# Patient Record
Sex: Female | Born: 1950
Health system: Southern US, Community
[De-identification: ages and names within clinical notes are randomized; demographics above are authoritative.]

## PROBLEM LIST (undated history)

## (undated) DIAGNOSIS — N6019 Diffuse cystic mastopathy of unspecified breast: Secondary | ICD-10-CM

## (undated) DIAGNOSIS — E079 Disorder of thyroid, unspecified: Secondary | ICD-10-CM

## (undated) DIAGNOSIS — I10 Essential (primary) hypertension: Secondary | ICD-10-CM

## (undated) HISTORY — DX: Diffuse cystic mastopathy of unspecified breast: N60.19

## (undated) HISTORY — DX: Disorder of thyroid, unspecified: E07.9

## (undated) HISTORY — PX: BREAST BIOPSY: SHX20

## (undated) HISTORY — DX: Essential (primary) hypertension: I10

---

## 2011-05-02 LAB — HM DEXA SCAN

## 2013-09-16 LAB — HM COLONOSCOPY

## 2015-11-11 LAB — HM MAMMOGRAPHY

## 2016-03-18 DIAGNOSIS — M62830 Muscle spasm of back: Secondary | ICD-10-CM | POA: Diagnosis not present

## 2016-03-18 DIAGNOSIS — M9903 Segmental and somatic dysfunction of lumbar region: Secondary | ICD-10-CM | POA: Diagnosis not present

## 2016-03-18 DIAGNOSIS — M47816 Spondylosis without myelopathy or radiculopathy, lumbar region: Secondary | ICD-10-CM | POA: Diagnosis not present

## 2016-03-18 DIAGNOSIS — M9902 Segmental and somatic dysfunction of thoracic region: Secondary | ICD-10-CM | POA: Diagnosis not present

## 2016-03-21 DIAGNOSIS — M47816 Spondylosis without myelopathy or radiculopathy, lumbar region: Secondary | ICD-10-CM | POA: Diagnosis not present

## 2016-03-21 DIAGNOSIS — M62830 Muscle spasm of back: Secondary | ICD-10-CM | POA: Diagnosis not present

## 2016-03-21 DIAGNOSIS — M9903 Segmental and somatic dysfunction of lumbar region: Secondary | ICD-10-CM | POA: Diagnosis not present

## 2016-03-21 DIAGNOSIS — M9902 Segmental and somatic dysfunction of thoracic region: Secondary | ICD-10-CM | POA: Diagnosis not present

## 2016-04-04 DIAGNOSIS — M9903 Segmental and somatic dysfunction of lumbar region: Secondary | ICD-10-CM | POA: Diagnosis not present

## 2016-04-04 DIAGNOSIS — M47816 Spondylosis without myelopathy or radiculopathy, lumbar region: Secondary | ICD-10-CM | POA: Diagnosis not present

## 2016-04-04 DIAGNOSIS — M9902 Segmental and somatic dysfunction of thoracic region: Secondary | ICD-10-CM | POA: Diagnosis not present

## 2016-04-04 DIAGNOSIS — M62830 Muscle spasm of back: Secondary | ICD-10-CM | POA: Diagnosis not present

## 2016-04-06 ENCOUNTER — Encounter: Payer: Self-pay | Admitting: Family Medicine

## 2016-04-06 ENCOUNTER — Ambulatory Visit (INDEPENDENT_AMBULATORY_CARE_PROVIDER_SITE_OTHER): Payer: Medicare Other | Admitting: Family Medicine

## 2016-04-06 DIAGNOSIS — M545 Low back pain, unspecified: Secondary | ICD-10-CM

## 2016-04-06 DIAGNOSIS — E038 Other specified hypothyroidism: Secondary | ICD-10-CM

## 2016-04-06 DIAGNOSIS — R1013 Epigastric pain: Secondary | ICD-10-CM | POA: Diagnosis not present

## 2016-04-06 DIAGNOSIS — M549 Dorsalgia, unspecified: Secondary | ICD-10-CM | POA: Insufficient documentation

## 2016-04-06 DIAGNOSIS — E559 Vitamin D deficiency, unspecified: Secondary | ICD-10-CM

## 2016-04-06 DIAGNOSIS — E039 Hypothyroidism, unspecified: Secondary | ICD-10-CM | POA: Insufficient documentation

## 2016-04-06 DIAGNOSIS — E785 Hyperlipidemia, unspecified: Secondary | ICD-10-CM | POA: Insufficient documentation

## 2016-04-06 NOTE — Patient Instructions (Addendum)
Thank you for coming in today. You should hear from [facility] soon about the ultrasound.  Make sure you are fasting.  Get labs as well.  Return sooner if needed.  If your belly pain worsens, or you have high fever, bad vomiting, blood in your stool or black tarry stool go to the Emergency Room.    Abdominal Pain, Adult Many things can cause abdominal pain. Usually, abdominal pain is not caused by a disease and will improve without treatment. It can often be observed and treated at home. Your health care provider will do a physical exam and possibly order blood tests and X-rays to help determine the seriousness of your pain. However, in many cases, more time must pass before a clear cause of the pain can be found. Before that point, your health care provider may not know if you need more testing or further treatment. HOME CARE INSTRUCTIONS Monitor your abdominal pain for any changes. The following actions may help to alleviate any discomfort you are experiencing:  Only take over-the-counter or prescription medicines as directed by your health care provider.  Do not take laxatives unless directed to do so by your health care provider.  Try a clear liquid diet (broth, tea, or water) as directed by your health care provider. Slowly move to a bland diet as tolerated. SEEK MEDICAL CARE IF:  You have unexplained abdominal pain.  You have abdominal pain associated with nausea or diarrhea.  You have pain when you urinate or have a bowel movement.  You experience abdominal pain that wakes you in the night.  You have abdominal pain that is worsened or improved by eating food.  You have abdominal pain that is worsened with eating fatty foods.  You have a fever. SEEK IMMEDIATE MEDICAL CARE IF:  Your pain does not go away within 2 hours.  You keep throwing up (vomiting).  Your pain is felt only in portions of the abdomen, such as the right side or the left lower portion of the  abdomen.  You pass bloody or black tarry stools. MAKE SURE YOU:  Understand these instructions.  Will watch your condition.  Will get help right away if you are not doing well or get worse.   This information is not intended to replace advice given to you by your health care provider. Make sure you discuss any questions you have with your health care provider.   Document Released: 06/01/2005 Document Revised: 05/13/2015 Document Reviewed: 05/01/2013 Elsevier Interactive Patient Education [employer]

## 2016-04-06 NOTE — Progress Notes (Signed)
Melanie Wilkinson is a 65 y.o. female who presents to [facility]: Primary Care Sports Medicine today for establish care discussed back and abdominal pain.   Patient notes onset of midepigastric abdominal pain over the last few days. She feels bloated and nauseated with no vomiting. Pain is mild. Food seems to worsen the pain. She notes normal bowel movements without blood. No vaginal discharge or urinary frequency urgency or dysuria. She has not tried any medications yet.  Additionally patient notes left low back pain occurring about 3 days ago. Pain is worse with activity better with rest. No radiating pain weakness or numbness. No treatment tried yet.  She feels well otherwise.  Patient has a history of hyperlipidemia, hypothyroidism, and vitamin D deficiency. She feels well with her medications listed below.  Past Medical History:  Diagnosis Date  . Hypertension   . Thyroid disease    History reviewed. No pertinent surgical history. Social History  Substance Use Topics  . Smoking status: Former Games developer  . Smokeless tobacco: Never Used  . Alcohol use Yes   family history includes Hypertension in her father and mother.  ROS as above: No headache, visual changes, nausea, vomiting, diarrhea, constipation, dizziness, abdominal pain, skin rash, fevers, chills, night sweats, weight loss, swollen lymph nodes, body aches, joint swelling, muscle aches, chest pain, shortness of breath, mood changes, visual or auditory hallucinations.     Medications: Current Outpatient Prescriptions  Medication Sig Dispense Refill  . atorvastatin (LIPITOR) 20 MG tablet     . levothyroxine (SYNTHROID, LEVOTHROID) 50 MCG tablet      No current facility-administered medications for this visit.    No Known Allergies   Exam:  BP 124/79   Pulse 85   Ht 5\' 3"  (1.6 m)   Wt 146 lb (66.2 kg)   BMI 25.86 kg/m  Gen: Well  NAD HEENT: EOMI,  MMM Lungs: Normal work of breathing. CTABL Heart: RRR no MRG Abd: NABS, Soft. Nondistended, Mildly tender without rebound or guarding or masses palpated Exts: Brisk capillary refill, warm and well perfused.  Back: Nontender to midline. Tender palpation left lumbar paraspinal muscles. Normal back motion. Normal lower extremity strength and gait.  No results found for this or any previous visit (from the past 24 hour(s)). No results found.    Assessment and Plan: 65 y.o. female with   Abdominal pain: Unclear etiology. Plan to obtain abdominal ultrasound CBC CMP and lipase. Return if not better.  Low back pain: Likely muscular Dysfunction. Plan for eating pattern home exercise program.  Hyperlipidemia: Check fasting lipids continue statin  Hypothyroidism: Check TSH. Continue levothyroxine  Vitamin D deficiency: Check vitamin D  Patient declines shingles and pneumonia vaccines as well as influenza vaccines.   Orders Placed This Encounter  Procedures  . US Abdomen Complete    Order Specific Question:   Reason for Exam (SYMPTOM  OR DIAGNOSIS REQUIRED)    Answer:   epigastric pain    Order Specific Question:   Preferred imaging location?    [facility]:   [facility]  . CBC  . Comprehensive metabolic panel    Order Specific Question:   Has the patient fasted?    Answer:   No  . Lipase  . Lipid panel    Order Specific Question:   Has the patient fasted?    Answer:   No  . TSH  . VITAMIN D 25 Hydroxy (Vit-D Deficiency, Fractures)    Discussed warning  signs or symptoms. Please see discharge instructions. Patient expresses understanding.

## 2016-04-07 ENCOUNTER — Ambulatory Visit (INDEPENDENT_AMBULATORY_CARE_PROVIDER_SITE_OTHER): Payer: Medicare Other

## 2016-04-07 DIAGNOSIS — R1013 Epigastric pain: Secondary | ICD-10-CM | POA: Diagnosis not present

## 2016-04-07 DIAGNOSIS — E038 Other specified hypothyroidism: Secondary | ICD-10-CM | POA: Diagnosis not present

## 2016-04-07 DIAGNOSIS — E559 Vitamin D deficiency, unspecified: Secondary | ICD-10-CM | POA: Diagnosis not present

## 2016-04-07 DIAGNOSIS — R140 Abdominal distension (gaseous): Secondary | ICD-10-CM | POA: Diagnosis not present

## 2016-04-07 DIAGNOSIS — R110 Nausea: Secondary | ICD-10-CM

## 2016-04-07 DIAGNOSIS — E785 Hyperlipidemia, unspecified: Secondary | ICD-10-CM | POA: Diagnosis not present

## 2016-04-07 LAB — LIPID PANEL
Cholesterol: 143 mg/dL (ref 125–200)
HDL: 67 mg/dL (ref >=46)
LDL CALC: 59 mg/dL (ref <130)
TRIGLYCERIDES: 85 mg/dL (ref <150)
Total CHOL/HDL Ratio: 2.1 Ratio (ref <=5.0)
VLDL: 17 mg/dL (ref <30)

## 2016-04-07 LAB — CBC
HEMATOCRIT: 42.3 % (ref 35.0–45.0)
Hemoglobin: 13.7 g/dL (ref 11.7–15.5)
MCH: 29.7 pg (ref 27.0–33.0)
MCHC: 32.4 g/dL (ref 32.0–36.0)
MCV: 91.8 fL (ref 80.0–100.0)
MPV: 10.6 fL (ref 7.5–12.5)
Platelets: 340 10*3/uL (ref 140–400)
RBC: 4.61 MIL/uL (ref 3.80–5.10)
RDW: 13.1 % (ref 11.0–15.0)
WBC: 3.4 10*3/uL — ABNORMAL LOW (ref 3.8–10.8)

## 2016-04-07 LAB — COMPREHENSIVE METABOLIC PANEL
ALK PHOS: 57 U/L (ref 33–130)
ALT: 19 U/L (ref 6–29)
AST: 21 U/L (ref 10–35)
Albumin: 3.9 g/dL (ref 3.6–5.1)
BUN: 11 mg/dL (ref 7–25)
CALCIUM: 9.3 mg/dL (ref 8.6–10.4)
CO2: 24 mmol/L (ref 20–31)
Chloride: 107 mmol/L (ref 98–110)
Creat: 0.73 mg/dL (ref 0.50–0.99)
GLUCOSE: 91 mg/dL (ref 65–99)
POTASSIUM: 4.3 mmol/L (ref 3.5–5.3)
Sodium: 142 mmol/L (ref 135–146)
Total Bilirubin: 0.3 mg/dL (ref 0.2–1.2)
Total Protein: 6.5 g/dL (ref 6.1–8.1)

## 2016-04-07 LAB — LIPASE: Lipase: 25 U/L (ref 7–60)

## 2016-04-07 LAB — TSH: TSH: 3.45 mIU/L

## 2016-04-08 LAB — VITAMIN D 25 HYDROXY (VIT D DEFICIENCY, FRACTURES): Vit D, 25-Hydroxy: 45 ng/mL (ref 30–100)

## 2016-04-12 ENCOUNTER — Encounter: Payer: Self-pay | Admitting: Family Medicine

## 2016-04-25 DIAGNOSIS — M9903 Segmental and somatic dysfunction of lumbar region: Secondary | ICD-10-CM | POA: Diagnosis not present

## 2016-04-25 DIAGNOSIS — M9902 Segmental and somatic dysfunction of thoracic region: Secondary | ICD-10-CM | POA: Diagnosis not present

## 2016-04-25 DIAGNOSIS — M62830 Muscle spasm of back: Secondary | ICD-10-CM | POA: Diagnosis not present

## 2016-04-25 DIAGNOSIS — M47816 Spondylosis without myelopathy or radiculopathy, lumbar region: Secondary | ICD-10-CM | POA: Diagnosis not present

## 2016-04-27 DIAGNOSIS — M9903 Segmental and somatic dysfunction of lumbar region: Secondary | ICD-10-CM | POA: Diagnosis not present

## 2016-04-27 DIAGNOSIS — M47816 Spondylosis without myelopathy or radiculopathy, lumbar region: Secondary | ICD-10-CM | POA: Diagnosis not present

## 2016-04-27 DIAGNOSIS — M62830 Muscle spasm of back: Secondary | ICD-10-CM | POA: Diagnosis not present

## 2016-04-27 DIAGNOSIS — M9902 Segmental and somatic dysfunction of thoracic region: Secondary | ICD-10-CM | POA: Diagnosis not present

## 2016-04-29 DIAGNOSIS — M9902 Segmental and somatic dysfunction of thoracic region: Secondary | ICD-10-CM | POA: Diagnosis not present

## 2016-04-29 DIAGNOSIS — M62830 Muscle spasm of back: Secondary | ICD-10-CM | POA: Diagnosis not present

## 2016-04-29 DIAGNOSIS — M546 Pain in thoracic spine: Secondary | ICD-10-CM | POA: Diagnosis not present

## 2016-04-29 DIAGNOSIS — M47816 Spondylosis without myelopathy or radiculopathy, lumbar region: Secondary | ICD-10-CM | POA: Diagnosis not present

## 2016-04-29 DIAGNOSIS — M9903 Segmental and somatic dysfunction of lumbar region: Secondary | ICD-10-CM | POA: Diagnosis not present

## 2016-05-02 DIAGNOSIS — M546 Pain in thoracic spine: Secondary | ICD-10-CM | POA: Diagnosis not present

## 2016-05-02 DIAGNOSIS — M47816 Spondylosis without myelopathy or radiculopathy, lumbar region: Secondary | ICD-10-CM | POA: Diagnosis not present

## 2016-05-02 DIAGNOSIS — M9902 Segmental and somatic dysfunction of thoracic region: Secondary | ICD-10-CM | POA: Diagnosis not present

## 2016-05-02 DIAGNOSIS — M62830 Muscle spasm of back: Secondary | ICD-10-CM | POA: Diagnosis not present

## 2016-05-02 DIAGNOSIS — M9903 Segmental and somatic dysfunction of lumbar region: Secondary | ICD-10-CM | POA: Diagnosis not present

## 2016-05-04 DIAGNOSIS — M546 Pain in thoracic spine: Secondary | ICD-10-CM | POA: Diagnosis not present

## 2016-05-04 DIAGNOSIS — M9903 Segmental and somatic dysfunction of lumbar region: Secondary | ICD-10-CM | POA: Diagnosis not present

## 2016-05-04 DIAGNOSIS — M47816 Spondylosis without myelopathy or radiculopathy, lumbar region: Secondary | ICD-10-CM | POA: Diagnosis not present

## 2016-05-04 DIAGNOSIS — M9902 Segmental and somatic dysfunction of thoracic region: Secondary | ICD-10-CM | POA: Diagnosis not present

## 2016-05-06 DIAGNOSIS — M47816 Spondylosis without myelopathy or radiculopathy, lumbar region: Secondary | ICD-10-CM | POA: Diagnosis not present

## 2016-05-06 DIAGNOSIS — M9903 Segmental and somatic dysfunction of lumbar region: Secondary | ICD-10-CM | POA: Diagnosis not present

## 2016-05-06 DIAGNOSIS — M546 Pain in thoracic spine: Secondary | ICD-10-CM | POA: Diagnosis not present

## 2016-05-06 DIAGNOSIS — M9902 Segmental and somatic dysfunction of thoracic region: Secondary | ICD-10-CM | POA: Diagnosis not present

## 2016-05-10 DIAGNOSIS — M9902 Segmental and somatic dysfunction of thoracic region: Secondary | ICD-10-CM | POA: Diagnosis not present

## 2016-05-10 DIAGNOSIS — M546 Pain in thoracic spine: Secondary | ICD-10-CM | POA: Diagnosis not present

## 2016-05-10 DIAGNOSIS — M9903 Segmental and somatic dysfunction of lumbar region: Secondary | ICD-10-CM | POA: Diagnosis not present

## 2016-05-10 DIAGNOSIS — M47816 Spondylosis without myelopathy or radiculopathy, lumbar region: Secondary | ICD-10-CM | POA: Diagnosis not present

## 2016-05-11 DIAGNOSIS — M47816 Spondylosis without myelopathy or radiculopathy, lumbar region: Secondary | ICD-10-CM | POA: Diagnosis not present

## 2016-05-11 DIAGNOSIS — M9903 Segmental and somatic dysfunction of lumbar region: Secondary | ICD-10-CM | POA: Diagnosis not present

## 2016-05-11 DIAGNOSIS — M546 Pain in thoracic spine: Secondary | ICD-10-CM | POA: Diagnosis not present

## 2016-05-11 DIAGNOSIS — M9902 Segmental and somatic dysfunction of thoracic region: Secondary | ICD-10-CM | POA: Diagnosis not present

## 2016-05-16 ENCOUNTER — Encounter: Payer: Self-pay | Admitting: Family Medicine

## 2016-05-16 DIAGNOSIS — M9903 Segmental and somatic dysfunction of lumbar region: Secondary | ICD-10-CM | POA: Diagnosis not present

## 2016-05-16 DIAGNOSIS — M8580 Other specified disorders of bone density and structure, unspecified site: Secondary | ICD-10-CM | POA: Insufficient documentation

## 2016-05-16 DIAGNOSIS — I253 Aneurysm of heart: Secondary | ICD-10-CM | POA: Insufficient documentation

## 2016-05-16 DIAGNOSIS — M47816 Spondylosis without myelopathy or radiculopathy, lumbar region: Secondary | ICD-10-CM | POA: Diagnosis not present

## 2016-05-16 DIAGNOSIS — M546 Pain in thoracic spine: Secondary | ICD-10-CM | POA: Diagnosis not present

## 2016-05-16 DIAGNOSIS — M9902 Segmental and somatic dysfunction of thoracic region: Secondary | ICD-10-CM | POA: Diagnosis not present

## 2016-05-18 DIAGNOSIS — M546 Pain in thoracic spine: Secondary | ICD-10-CM | POA: Diagnosis not present

## 2016-05-18 DIAGNOSIS — M9903 Segmental and somatic dysfunction of lumbar region: Secondary | ICD-10-CM | POA: Diagnosis not present

## 2016-05-18 DIAGNOSIS — M47816 Spondylosis without myelopathy or radiculopathy, lumbar region: Secondary | ICD-10-CM | POA: Diagnosis not present

## 2016-05-18 DIAGNOSIS — M9902 Segmental and somatic dysfunction of thoracic region: Secondary | ICD-10-CM | POA: Diagnosis not present

## 2016-05-19 DIAGNOSIS — M9903 Segmental and somatic dysfunction of lumbar region: Secondary | ICD-10-CM | POA: Diagnosis not present

## 2016-05-19 DIAGNOSIS — M546 Pain in thoracic spine: Secondary | ICD-10-CM | POA: Diagnosis not present

## 2016-05-19 DIAGNOSIS — M9902 Segmental and somatic dysfunction of thoracic region: Secondary | ICD-10-CM | POA: Diagnosis not present

## 2016-05-19 DIAGNOSIS — M47816 Spondylosis without myelopathy or radiculopathy, lumbar region: Secondary | ICD-10-CM | POA: Diagnosis not present

## 2016-05-30 DIAGNOSIS — M47816 Spondylosis without myelopathy or radiculopathy, lumbar region: Secondary | ICD-10-CM | POA: Diagnosis not present

## 2016-05-30 DIAGNOSIS — M546 Pain in thoracic spine: Secondary | ICD-10-CM | POA: Diagnosis not present

## 2016-05-30 DIAGNOSIS — M9903 Segmental and somatic dysfunction of lumbar region: Secondary | ICD-10-CM | POA: Diagnosis not present

## 2016-05-30 DIAGNOSIS — M9902 Segmental and somatic dysfunction of thoracic region: Secondary | ICD-10-CM | POA: Diagnosis not present

## 2016-06-01 DIAGNOSIS — M9902 Segmental and somatic dysfunction of thoracic region: Secondary | ICD-10-CM | POA: Diagnosis not present

## 2016-06-01 DIAGNOSIS — M9903 Segmental and somatic dysfunction of lumbar region: Secondary | ICD-10-CM | POA: Diagnosis not present

## 2016-06-01 DIAGNOSIS — M47816 Spondylosis without myelopathy or radiculopathy, lumbar region: Secondary | ICD-10-CM | POA: Diagnosis not present

## 2016-06-01 DIAGNOSIS — M546 Pain in thoracic spine: Secondary | ICD-10-CM | POA: Diagnosis not present

## 2016-06-02 DIAGNOSIS — M9902 Segmental and somatic dysfunction of thoracic region: Secondary | ICD-10-CM | POA: Diagnosis not present

## 2016-06-02 DIAGNOSIS — M9903 Segmental and somatic dysfunction of lumbar region: Secondary | ICD-10-CM | POA: Diagnosis not present

## 2016-06-02 DIAGNOSIS — M546 Pain in thoracic spine: Secondary | ICD-10-CM | POA: Diagnosis not present

## 2016-06-02 DIAGNOSIS — M47816 Spondylosis without myelopathy or radiculopathy, lumbar region: Secondary | ICD-10-CM | POA: Diagnosis not present

## 2016-06-06 ENCOUNTER — Encounter: Payer: Self-pay | Admitting: Family Medicine

## 2016-06-15 DIAGNOSIS — M9902 Segmental and somatic dysfunction of thoracic region: Secondary | ICD-10-CM | POA: Diagnosis not present

## 2016-06-15 DIAGNOSIS — M9903 Segmental and somatic dysfunction of lumbar region: Secondary | ICD-10-CM | POA: Diagnosis not present

## 2016-06-15 DIAGNOSIS — M47816 Spondylosis without myelopathy or radiculopathy, lumbar region: Secondary | ICD-10-CM | POA: Diagnosis not present

## 2016-06-15 DIAGNOSIS — M546 Pain in thoracic spine: Secondary | ICD-10-CM | POA: Diagnosis not present

## 2016-10-12 DIAGNOSIS — Z23 Encounter for immunization: Secondary | ICD-10-CM | POA: Diagnosis not present

## 2016-10-13 ENCOUNTER — Ambulatory Visit: Payer: Medicare Other

## 2017-01-24 ENCOUNTER — Encounter: Payer: Self-pay | Admitting: Family Medicine

## 2017-01-25 MED ORDER — ATORVASTATIN CALCIUM 20 MG PO TABS: 20.0000 mg | ORAL_TABLET | Freq: Every day | ORAL | 1 refills | Status: DC

## 2017-04-06 ENCOUNTER — Emergency Department (INDEPENDENT_AMBULATORY_CARE_PROVIDER_SITE_OTHER)
Admission: EM | Admit: 2017-04-06 | Discharge: 2017-04-06 | Disposition: A | Discharge status: Home / Self Care | Payer: Medicare Other | Source: Home / Self Care | Attending: Family Medicine | Admitting: Family Medicine

## 2017-04-06 ENCOUNTER — Emergency Department (INDEPENDENT_AMBULATORY_CARE_PROVIDER_SITE_OTHER): Payer: Medicare Other

## 2017-04-06 ENCOUNTER — Encounter: Payer: Self-pay | Admitting: Emergency Medicine

## 2017-04-06 DIAGNOSIS — M25572 Pain in left ankle and joints of left foot: Secondary | ICD-10-CM | POA: Diagnosis not present

## 2017-04-06 DIAGNOSIS — S8262XA Displaced fracture of lateral malleolus of left fibula, initial encounter for closed fracture: Secondary | ICD-10-CM

## 2017-04-06 DIAGNOSIS — S99912A Unspecified injury of left ankle, initial encounter: Secondary | ICD-10-CM | POA: Diagnosis not present

## 2017-04-06 DIAGNOSIS — M7989 Other specified soft tissue disorders: Secondary | ICD-10-CM | POA: Diagnosis not present

## 2017-04-06 DIAGNOSIS — S99922A Unspecified injury of left foot, initial encounter: Secondary | ICD-10-CM | POA: Diagnosis not present

## 2017-04-06 NOTE — ED Provider Notes (Signed)
CSN: [id number]     Arrival date & time 04/06/17  1701 History   First MD Initiated Contact with Patient 04/06/17 1739     Chief Complaint  Patient presents with  . Ankle Injury   (Consider location/radiation/quality/duration/timing/severity/associated sxs/prior Treatment) HPI Melanie Wilkinson is a 66 y.o. female presenting to UC with c/o Left lateral ankle pain that started about 2 days ago after she [place], twisting her ankle.  She has noticed worsening swelling and bruising.  She has used an OTC ankle sleeve and taken ibuprofen with mild relief.  Pain is aching and sore, 5/10. Worse with plantarflexion. No other injuries from the fall. No prior injury or surgery to Left ankle.    Past Medical History:  Diagnosis Date  . Hypertension   . Thyroid disease    History reviewed. No pertinent surgical history. Family History  Problem Relation Age of Onset  . Hypertension Mother   . Hypertension Father    Social History  Substance Use Topics  . Smoking status: Former Games developermoker  . Smokeless tobacco: Never Used  . Alcohol use Yes   OB History    No data available     Review of Systems  Musculoskeletal: Positive for arthralgias, gait problem and joint swelling. Negative for myalgias.  Skin: Positive for color change. Negative for wound.  Neurological: Negative for weakness and numbness.    Allergies  Patient has no known allergies.  Home Medications   Prior to Admission medications   Medication Sig Start Date End Date Taking? Authorizing Provider  atorvastatin (LIPITOR) 20 MG tablet Take 1 tablet (20 mg total) by mouth daily at 6 PM. 01/25/17   Rodolph Bongorey, Evan S, MD  levothyroxine Erline Levine(SYNTHROID, LEVOTHROID) 50 MCG tablet  02/16/16   [provider]   Meds Ordered and Administered this Visit  Medications - No data to display  BP 135/78 (BP Location: Left Arm)   Pulse 73   Temp 98.2 F (36.8 C) (Oral)   Ht 5\' 3"  (1.6 m)   Wt 144 lb (65.3 kg)    SpO2 98%   BMI 25.51 kg/m  No data found.   Physical Exam  Constitutional: She is oriented to person, place, and time. She appears well-developed and well-nourished. No distress.  HENT:  Head: Normocephalic and atraumatic.  Eyes: EOM are normal.  Neck: Normal range of motion.  Cardiovascular: Normal rate.   Pulmonary/Chest: Effort normal.  Musculoskeletal: Normal range of motion. She exhibits edema and tenderness.  Left ankle and foot: mild to moderate edema, worse over dorsal aspect of foot. Tenderness to Lateral ankle. Full ROM, increased pain with full plantarflexion Calf is soft, non-tender.  Neurological: She is alert and oriented to person, place, and time.  Skin: Skin is warm and dry. Capillary refill takes less than 2 seconds. She is not diaphoretic.  Left ankle and foot: skin in tact. Diffuse ecchymosis   Psychiatric: She has a normal mood and affect. Her behavior is normal.  Nursing note and vitals reviewed.   Urgent Care Course     Procedures (including critical care time)  Labs Review Labs Reviewed - No data to display  Imaging Review Dg Ankle Complete Left  Result Date: 04/06/2017 CLINICAL DATA:  Fall, left ankle pain/swelling EXAM: LEFT ANKLE COMPLETE - 3+ VIEW COMPARISON:  None. FINDINGS: Suspected cortical irregularity involving the distal aspect of the lateral malleolus, suggesting nondisplaced fracture. Overlying mild soft tissue swelling. The ankle mortise is intact. IMPRESSION: Cortical  irregularity involving the distal aspect of the lateral malleolus, suggesting nondisplaced fracture. Correlate for point tenderness. Mild lateral soft tissue swelling. Electronically Signed   By: Sriyesh  Krishnan M.D.   On: 04/06/2017 17:58   Dg Foot CompCharline Billslete Left  Result Date: 04/06/2017 CLINICAL DATA:  Fall, lateral pain/swell EXAM: LEFT FOOT - COMPLETE 3+ VIEW COMPARISON:  None. FINDINGS: No fracture or dislocation is seen. Mild degenerative changes of the 1st MTP joint.  The visualized soft tissues are unremarkable. Small plantar and posterior calcaneal enthesophytes. IMPRESSION: Negative. Electronically Signed   By: Charline BillsSriyesh  Krishnan M.D.   On: 04/06/2017 17:58      MDM   1. Pain of joint of left ankle and foot   2. Closed fracture of distal lateral malleolus of ankle, left, initial encounter    Plain films and exam c/w nondisplaced fracture of lateral malleolus  Pt put in cam walker boot for comfort Home care instructions provided Declined prescription pain medication F/u with PCP, Dr. Denyse Amassorey, Sports Medicine in 1-2 weeks for recheck of symptoms and continued management of injury.     Lurene Shadowhelps, Angellynn Kimberlin O, [place]C 04/06/17 [phone]

## 2017-04-06 NOTE — Discharge Instructions (Signed)
°  You may take 500mg  acetaminophen every 4-6 hours or in combination with ibuprofen 400-600mg  every 6-8 hours as needed for pain and swelling.  You may take your boot off to bath and if needed to sleep but it is recommended you wear it when walking around to help allow ankle to heal.  There are home stretches you may try once the initial pain starts to improve, however, if pain is moderately to significantly worse with the exercises, wait a few more days before retrying.

## 2017-04-06 NOTE — ED Triage Notes (Signed)
Left ankle and foot injury while walking on a slippery boardwalk fell and twisted.

## 2017-04-20 ENCOUNTER — Ambulatory Visit (INDEPENDENT_AMBULATORY_CARE_PROVIDER_SITE_OTHER): Payer: Medicare Other | Admitting: Family Medicine

## 2017-04-20 ENCOUNTER — Ambulatory Visit (INDEPENDENT_AMBULATORY_CARE_PROVIDER_SITE_OTHER): Payer: Medicare Other

## 2017-04-20 VITALS — BP 118/83 | HR 72 | Wt 149.0 lb

## 2017-04-20 DIAGNOSIS — S82892A Other fracture of left lower leg, initial encounter for closed fracture: Secondary | ICD-10-CM | POA: Insufficient documentation

## 2017-04-20 DIAGNOSIS — M8589 Other specified disorders of bone density and structure, multiple sites: Secondary | ICD-10-CM | POA: Diagnosis not present

## 2017-04-20 DIAGNOSIS — E038 Other specified hypothyroidism: Secondary | ICD-10-CM | POA: Diagnosis not present

## 2017-04-20 DIAGNOSIS — E785 Hyperlipidemia, unspecified: Secondary | ICD-10-CM

## 2017-04-20 DIAGNOSIS — S82402D Unspecified fracture of shaft of left fibula, subsequent encounter for closed fracture with routine healing: Secondary | ICD-10-CM | POA: Diagnosis not present

## 2017-04-20 DIAGNOSIS — X58XXXD Exposure to other specified factors, subsequent encounter: Secondary | ICD-10-CM

## 2017-04-20 DIAGNOSIS — E559 Vitamin D deficiency, unspecified: Secondary | ICD-10-CM

## 2017-04-20 DIAGNOSIS — S99912A Unspecified injury of left ankle, initial encounter: Secondary | ICD-10-CM | POA: Diagnosis not present

## 2017-04-20 LAB — CBC
HCT: 43.4 % (ref 35.0–45.0)
Hemoglobin: 14.1 g/dL (ref 11.7–15.5)
MCH: 30.2 pg (ref 27.0–33.0)
MCHC: 32.5 g/dL (ref 32.0–36.0)
MCV: 92.9 fL (ref 80.0–100.0)
MPV: 10.6 fL (ref 7.5–12.5)
PLATELETS: 408 10*3/uL — AB (ref 140–400)
RBC: 4.67 MIL/uL (ref 3.80–5.10)
RDW: 13.4 % (ref 11.0–15.0)
WBC: 4.8 10*3/uL (ref 3.8–10.8)

## 2017-04-20 LAB — TSH: TSH: 3.02 m[IU]/L

## 2017-04-20 NOTE — Progress Notes (Signed)
Melanie Wilkinson is a 66 y.o. female who presents to [facility]: Primary Care Sports Medicine today for  Follow-up ankle fracture, hypothyroidism, hyperlipidemia.  Ankle fracture: Patient suffered an injury a few weeks ago. She was seen in urgent care on 04/06/17 where she was diagnosed with an avulsion type fracture to the lateral malleolus. She was treated with a cam walker boot and notes that she is nearly pain free. She feels well.  Hypothyroidism: Patient takes 50 g of levothyroxine daily and feels well. She denies feeling too hot or too cold.  Hyperlipidemia: Patient takes 20 mg of atorvastatin daily and feels well with no muscle aches or pains  Past Medical History:  Diagnosis Date  . Hypertension   . Thyroid disease    No past surgical history on file. Social History  Substance Use Topics  . Smoking status: Former Games developer  . Smokeless tobacco: Never Used  . Alcohol use Yes   family history includes Hypertension in her father and mother.  ROS as above:  Medications: Current Outpatient Prescriptions  Medication Sig Dispense Refill  . atorvastatin (LIPITOR) 20 MG tablet Take 1 tablet (20 mg total) by mouth daily at 6 PM. 90 tablet 1  . levothyroxine (SYNTHROID, LEVOTHROID) 50 MCG tablet      No current facility-administered medications for this visit.    No Known Allergies  Health Maintenance Health Maintenance  Topic Date Due  . Hepatitis C Screening  11-10-1950  . TETANUS/TDAP  10/09/1967  . COLONOSCOPY  10/08/1998  . INFLUENZA VACCINE  04/05/2017  . PNA vac Low Risk Adult (1 of 2 - PCV13) 04/06/2024 (Originally 10/08/2013)  . MAMMOGRAM  11/10/2017  . DEXA SCAN  Completed     Exam:  BP 118/83   Pulse 72   Wt 149 lb (67.6 kg)   BMI 26.39 kg/m  Gen: Well NAD HEENT: EOMI,  MMM No goiter Lungs: Normal work of breathing. CTABL Heart: RRR no MRG Abd: NABS, Soft. Nondistended,  Nontender Exts: Brisk capillary refill, warm and well perfused.  Left: Well appearing without effusion. Mildly tender to palpation distal lateral malleolus. Stable ligamentous exam. Pulses capillary refill and sensation intact distally.   No results found for this or any previous visit (from the past 72 hour(s)). Dg Ankle Complete Left  Result Date: 04/20/2017 CLINICAL DATA:  History of fibular fracture, followup EXAM: LEFT ANKLE COMPLETE - 3+ VIEW COMPARISON:  Left ankle films of 04/06/2017 FINDINGS: The nondisplaced fracture of the tip of the distal left fibula is unchanged. No other acute abnormality is seen. Alignment is normal. Degenerative calcaneal spurs are present. IMPRESSION: No change in nondisplaced fracture of the tip of the distal left fibula. Electronically Signed   By: Dwyane Dee M.D.   On: 04/20/2017 11:21      Assessment and Plan: 66 y.o. female with  Left ankle fracture doing well. Transition to Aircast type brace and recheck in 2 weeks. Ambulate as tolerated.  Hyperlipidemia: We'll recheck fasting lipids.  If doing well refills atorvastatin.  Hypothyroidism: Recheck TSH  Osteopenia: Her most recent bone density test over 5 years ago. With new fracture will repeat bone density test as well as check on vitamin D and calcium levels.    Orders Placed This Encounter  Procedures  . DG Ankle Complete Left    Standing Status:   Future    Number of Occurrences:   1    Standing Expiration Date:   06/20/2018  Order Specific Question:   Reason for Exam (SYMPTOM  OR DIAGNOSIS REQUIRED)    Answer:   follow up fracture    Order Specific Question:   [facility]?    [facility]    Order Specific Question:   Radiology Contrast Protocol - do NOT remove file path    Answer:   \\[email or web address]s.pdf  . DG Bone Density    Standing Status:   Future    Standing Expiration Date:   06/21/2018    Order Specific  Question:   Reason for exam:    Answer:   eval bone density    Order Specific Question:   [facility]erred imaging [facility]?    [facility]  . CBC  . COMPLETE METABOLIC PANEL WITH GFR  . Lipid Panel w/reflex Direct LDL  . TSH  . VITAMIN D 25 Hydroxy (Vit-D Deficiency, Fractures)   No orders of the defined types were placed in this encounter.    Discussed warning signs or symptoms. Please see discharge instructions. Patient expresses understanding.

## 2017-04-20 NOTE — Patient Instructions (Addendum)
Thank you for coming in today. We will try an Aircast. If that is better than the boot use it.  If the boot is better use that.  Follow your pain.  Get labs and xray today.  Recheck fracture in about 2 weeks.   Return sooner if needed.

## 2017-04-21 LAB — COMPLETE METABOLIC PANEL WITH GFR
ALT: 18 U/L (ref 6–29)
AST: 19 U/L (ref 10–35)
Albumin: 4.6 g/dL (ref 3.6–5.1)
Alkaline Phosphatase: 58 U/L (ref 33–130)
BILIRUBIN TOTAL: 0.7 mg/dL (ref 0.2–1.2)
BUN: 21 mg/dL (ref 7–25)
CO2: 22 mmol/L (ref 20–32)
CREATININE: 0.73 mg/dL (ref 0.50–0.99)
Calcium: 10.2 mg/dL (ref 8.6–10.4)
Chloride: 105 mmol/L (ref 98–110)
GFR, Est African American: >89 mL/min (ref >=60)
GFR, Est Non African American: 85 mL/min (ref >=60)
GLUCOSE: 85 mg/dL (ref 65–99)
Potassium: 4.3 mmol/L (ref 3.5–5.3)
SODIUM: 141 mmol/L (ref 135–146)
TOTAL PROTEIN: 7.1 g/dL (ref 6.1–8.1)

## 2017-04-21 LAB — LIPID PANEL W/REFLEX DIRECT LDL
Cholesterol: 211 mg/dL — ABNORMAL HIGH (ref <200)
HDL: 83 mg/dL (ref >50)
LDL-CHOLESTEROL: 110 mg/dL — AB
Non-HDL Cholesterol (Calc): 128 mg/dL (ref <130)
Total CHOL/HDL Ratio: 2.5 Ratio (ref <5.0)
Triglycerides: 85 mg/dL (ref <150)

## 2017-04-21 LAB — VITAMIN D 25 HYDROXY (VIT D DEFICIENCY, FRACTURES): Vit D, 25-Hydroxy: 55 ng/mL (ref 30–100)

## 2017-04-21 MED ORDER — ATORVASTATIN CALCIUM 20 MG PO TABS: 20.0000 mg | ORAL_TABLET | Freq: Every day | ORAL | 3 refills | Status: DC

## 2017-04-21 MED ORDER — LEVOTHYROXINE SODIUM 50 MCG PO TABS: 50.0000 ug | ORAL_TABLET | Freq: Every day | ORAL | 3 refills | Status: DC

## 2017-04-21 NOTE — Addendum Note (Signed)
Addended by: Rodolph Bong on: 04/21/2017 05:36 AM   Modules accepted: Orders

## 2017-05-04 ENCOUNTER — Ambulatory Visit (INDEPENDENT_AMBULATORY_CARE_PROVIDER_SITE_OTHER): Payer: Medicare Other | Admitting: Family Medicine

## 2017-05-04 ENCOUNTER — Encounter: Payer: Self-pay | Admitting: Family Medicine

## 2017-05-04 ENCOUNTER — Ambulatory Visit (INDEPENDENT_AMBULATORY_CARE_PROVIDER_SITE_OTHER): Payer: Medicare Other

## 2017-05-04 VITALS — BP 131/82 | HR 72 | Wt 148.0 lb

## 2017-05-04 DIAGNOSIS — S82892A Other fracture of left lower leg, initial encounter for closed fracture: Secondary | ICD-10-CM

## 2017-05-04 DIAGNOSIS — S82832D Other fracture of upper and lower end of left fibula, subsequent encounter for closed fracture with routine healing: Secondary | ICD-10-CM

## 2017-05-04 DIAGNOSIS — Z23 Encounter for immunization: Secondary | ICD-10-CM | POA: Diagnosis not present

## 2017-05-04 DIAGNOSIS — W19XXXD Unspecified fall, subsequent encounter: Secondary | ICD-10-CM

## 2017-05-04 DIAGNOSIS — S82832A Other fracture of upper and lower end of left fibula, initial encounter for closed fracture: Secondary | ICD-10-CM | POA: Diagnosis not present

## 2017-05-04 NOTE — Patient Instructions (Signed)
Thank you for coming in today. Continue Aircast.  Recheck in 2-3 weeks.

## 2017-05-04 NOTE — Progress Notes (Signed)
   Melanie Wilkinson is a 66 y.o. female [facility] for follow-up ankle fracture. Patient was seen about a month ago originally for fracture of the left ankle. She had follow-up on the 16th. She's been doing well with her clamshell Aircast type device. She notes improving pain but continued mild pain with ambulation. She denies any radiating pain weakness or numbness fevers or chills.   Past Medical History:  Diagnosis Date  . Hypertension   . Thyroid disease    No past surgical history on file. Social History  Substance Use Topics  . Smoking status: Former Games developermoker  . Smokeless tobacco: Never Used  . Alcohol use Yes     ROS:  As above   Medications: Current Outpatient Prescriptions  Medication Sig Dispense Refill  . atorvastatin (LIPITOR) 20 MG tablet Take 1 tablet (20 mg total) by mouth daily. 90 tablet 3  . levothyroxine (SYNTHROID, LEVOTHROID) 50 MCG tablet Take 1 tablet (50 mcg total) by mouth daily. 90 tablet 3   No current facility-administered medications for this visit.    No Known Allergies   Exam:  BP 131/82   Pulse 72   Wt 148 lb (67.1 kg)   BMI 26.22 kg/m  General: Well Developed, well nourished, and in no acute distress.  Neuro/Psych: Alert and oriented x3, extra-ocular muscles intact, able to move all 4 extremities, sensation grossly intact. Skin: Warm and dry, no rashes noted.  Respiratory: Not using accessory muscles, speaking in full sentences, trachea midline.  Cardiovascular: Pulses palpable, no extremity edema. Abdomen: Does not appear distended. MSK: Left ankle normal appearing minimally tender distal fibula. Stable ligamentous exam. Pulses capillary refill and sensation are intact.    No results found for this or any previous visit (from the past 48 hour(s)). Dg Ankle Complete Left  Result Date: 05/04/2017 CLINICAL DATA:  66 year old female with fibular fracture, follow-up. EXAM: LEFT  ANKLE COMPLETE - 3+ VIEW COMPARISON:  04/20/2017 and priors. FINDINGS: Grossly stable appearance of a distal left fibular fracture with overlying soft tissue swelling. Alignment is normal. Mild degenerative changes within the remainder of the ankle. IMPRESSION: Unchanged appearance of a nondisplaced distal left fibular fracture. Electronically Signed   By: Sande BrothersSerena  Chacko M.D.   On: 05/04/2017 09:50      Assessment and Plan: 66 y.o. female with left ankle fracture doing well clinically. Not much healing on x-ray today. Plan to continue clamshell type device and recheck in 2 weeks.  Flu vaccine and Tdap vaccine given today prior to discharge.    Orders Placed This Encounter  Procedures  . DG Ankle Complete Left    Standing Status:   Future    Number of Occurrences:   1    Standing Expiration Date:   07/04/2018    Order Specific Question:   Reason for Exam (SYMPTOM  OR DIAGNOSIS REQUIRED)    Answer:   f/u fracture    Order Specific Question:   [facility] imaging [facility]?    [facility]    Order Specific Question:   Radiology Contrast Protocol - do NOT remove file path    Answer:   \\[email or web address]s.pdf  . Flu Vaccine QUAD 36+ mos IM  . Tdap vaccine greater than or equal to 7yo IM   No orders of the defined types were placed in this encounter.   Discussed warning signs or symptoms. Please see discharge instructions. Patient expresses understanding.

## 2017-05-10 ENCOUNTER — Ambulatory Visit (INDEPENDENT_AMBULATORY_CARE_PROVIDER_SITE_OTHER): Payer: Medicare Other

## 2017-05-10 DIAGNOSIS — M8589 Other specified disorders of bone density and structure, multiple sites: Secondary | ICD-10-CM | POA: Diagnosis not present

## 2017-05-10 DIAGNOSIS — Z780 Asymptomatic menopausal state: Secondary | ICD-10-CM | POA: Diagnosis not present

## 2017-05-10 DIAGNOSIS — S82892A Other fracture of left lower leg, initial encounter for closed fracture: Secondary | ICD-10-CM

## 2017-05-17 ENCOUNTER — Ambulatory Visit (INDEPENDENT_AMBULATORY_CARE_PROVIDER_SITE_OTHER): Payer: Medicare Other | Admitting: Family Medicine

## 2017-05-17 ENCOUNTER — Ambulatory Visit (INDEPENDENT_AMBULATORY_CARE_PROVIDER_SITE_OTHER): Payer: Medicare Other

## 2017-05-17 ENCOUNTER — Ambulatory Visit (HOSPITAL_BASED_OUTPATIENT_CLINIC_OR_DEPARTMENT_OTHER)
Admission: RE | Admit: 2017-05-17 | Discharge: 2017-05-17 | Disposition: A | Discharge status: Home / Self Care | Payer: Medicare Other | Source: Ambulatory Visit | Attending: Family Medicine | Admitting: Family Medicine

## 2017-05-17 VITALS — BP 133/84 | HR 91

## 2017-05-17 DIAGNOSIS — S82892A Other fracture of left lower leg, initial encounter for closed fracture: Secondary | ICD-10-CM

## 2017-05-17 DIAGNOSIS — W19XXXD Unspecified fall, subsequent encounter: Secondary | ICD-10-CM | POA: Diagnosis not present

## 2017-05-17 DIAGNOSIS — S82892D Other fracture of left lower leg, subsequent encounter for closed fracture with routine healing: Secondary | ICD-10-CM | POA: Diagnosis not present

## 2017-05-17 DIAGNOSIS — M7989 Other specified soft tissue disorders: Secondary | ICD-10-CM | POA: Diagnosis not present

## 2017-05-17 DIAGNOSIS — S82832D Other fracture of upper and lower end of left fibula, subsequent encounter for closed fracture with routine healing: Secondary | ICD-10-CM | POA: Diagnosis not present

## 2017-05-17 NOTE — Progress Notes (Signed)
   Melanie Wilkinson is a 66 y.o. female [facility] for ankle fracture. Patient presents to clinic today to follow-up her avulsion fracture of her left ankle lateral malleolus. She is doing well in the Aircast and is pain-free. She does note some leg swelling but denies any calf pain chest pain palpitations shortness of breath. She feels well otherwise.   Past Medical History:  Diagnosis Date  . Hypertension   . Thyroid disease    No past surgical history on file. Social History  Substance Use Topics  . Smoking status: Former Games developermoker  . Smokeless tobacco: Never Used  . Alcohol use Yes     ROS:  As above   Medications: Current Outpatient Prescriptions  Medication Sig Dispense Refill  . atorvastatin (LIPITOR) 20 MG tablet Take 1 tablet (20 mg total) by mouth daily. 90 tablet 3  . levothyroxine (SYNTHROID, LEVOTHROID) 50 MCG tablet Take 1 tablet (50 mcg total) by mouth daily. 90 tablet 3   No current facility-administered medications for this visit.    No Known Allergies   Exam:  BP 133/84   Pulse 91  General: Well Developed, well nourished, and in no acute distress.  Neuro/Psych: Alert and oriented x3, extra-ocular muscles intact, able to move all 4 extremities, sensation grossly intact. Skin: Warm and dry, no rashes noted.  Respiratory: Not using accessory muscles, speaking in full sentences, trachea midline.  Cardiovascular: Pulses palpable, no extremity edema. Abdomen: Does not appear distended. MSK:  Left leg slightly edema and calf Swelling. Ankle is nontender. Pulses capillary refill and sensation are intact.  X-ray Continued fracture at the distal fibula not significantly changed. No significant bony healing.   No results found for this or any previous visit (from the past 48 hour(s)). No results found.    Assessment and Plan: 66 y.o. female with  Ankle fracture: Clinically doing very well. Plan  to transition to ASO brace. Recheck in 2 weeks.  Leg swelling: Concerning for potential DVT. An ultrasound pending.    Orders Placed This Encounter  Procedures  . DG Ankle Complete Left    Standing Status:   Future    Number of Occurrences:   1    Standing Expiration Date:   07/17/2018    Order Specific Question:   Reason for Exam (SYMPTOM  OR DIAGNOSIS REQUIRED)    Answer:   eval fracture    Order Specific Question:   [facility]erred imaging [facility]?    [facility]    Order Specific Question:   Radiology Contrast Protocol - do NOT remove file path    Answer:   \\[email or web address]s.pdf  . US Venous Img Lower Unilateral Left    Standing Status:   Future    Standing Expiration Date:   07/17/2018    Order Specific Question:   Reason for Exam (SYMPTOM  OR DIAGNOSIS REQUIRED)    Answer:   eval ? dvt left leg swelling    Order Specific Question:   Preferred imaging location?    [facility] [place]   No orders of the defined types were placed in this encounter.   Discussed warning signs or symptoms. Please see discharge instructions. Patient expresses understanding. 3

## 2017-05-17 NOTE — Patient Instructions (Addendum)
Thank you for coming in today. Please go to the [facility] today at 515 for ultrasound of your left leg.  [address] [place], [place] [address] [phone]  We will switch to an ankle brace as guided by pain.  If your pain worsens return to the boot or and Aircast.    Deep Vein Thrombosis A deep vein thrombosis (DVT) is a blood clot (thrombus) that usually occurs in a deep, larger vein of the lower leg or the pelvis, or in an upper extremity such as the arm. These are dangerous and can lead to serious and even life-threatening complications if the clot travels to the lungs. A DVT can damage the valves in your leg veins so that instead of flowing upward, the blood pools in the lower leg. This is called post-thrombotic syndrome, and it can result in pain, swelling, discoloration, and sores on the leg. What are the causes? A DVT is caused by the formation of a blood clot in your leg, pelvis, or arm. Usually, several things contribute to the formation of blood clots. A clot may develop when:  Your blood flow slows down.  Your vein becomes damaged in some way.  You have a condition that makes your blood clot more easily.  What increases the risk? A DVT is more likely to develop in:  People who are older, especially over 41 years of age.  People who are overweight (obese).  People who sit or lie still for a long time, such as during long-distance travel (over 4 hours), bed rest, hospitalization, or during recovery from certain medical conditions like a stroke.  People who do not engage in much physical activity (sedentary lifestyle).  People who have chronic breathing disorders.  People who have a personal or family history of blood clots or blood clotting disease.  People who have peripheral vascular disease (PVD), diabetes, or some types of cancer.  People who have heart disease, especially if the person had a recent heart attack or has congestive heart  failure.  People who have neurological diseases that affect the legs (leg paresis).  People who have had a traumatic injury, such as breaking a hip or leg.  People who have recently had major or lengthy surgery, especially on the hip, knee, or abdomen.  People who have had a central line placed inside a large vein.  People who take medicines that contain the hormone estrogen. These include birth control pills and hormone replacement therapy.  Pregnancy or during childbirth or the postpartum period.  Long plane flights (over 8 hours).  What are the signs or symptoms?  Symptoms of a DVT can include:  Swelling of your leg or arm, especially if one side is much worse.  Warmth and redness of your leg or arm, especially if one side is much worse.  Pain in your arm or leg. If the clot is in your leg, symptoms may be more noticeable or worse when you stand or walk.  A feeling of pins and needles, if the clot is in the arm.  The symptoms of a DVT that has traveled to the lungs (pulmonary embolism, PE) usually start suddenly and include:  Shortness of breath while active or at rest.  Coughing or coughing up blood or blood-tinged mucus.  Chest pain that is often worse with deep breaths.  Rapid or irregular heartbeat.  Feeling light-headed or dizzy.  Fainting.  Feeling anxious.  Sweating.  There may also be pain and swelling in  a leg if that is where the blood clot started. These symptoms may represent a serious problem that is an emergency. Do not wait to see if the symptoms will go away. Get medical help right away. Call your local emergency services (911 in the [place] Do not drive yourself to the hospital. How is this diagnosed? Your health care provider will take a medical history and perform a physical exam. You may also have other tests, including:  Blood tests to assess the clotting properties of your blood.  Imaging tests, such as CT, ultrasound, MRI, X-ray, and other  tests to see if you have clots anywhere in your body.  How is this treated? After a DVT is identified, it can be treated. The type of treatment that you receive depends on many factors, such as the cause of your DVT, your risk for bleeding or developing more clots, and other medical conditions that you have. Sometimes, a combination of treatments is necessary. Treatment options may be combined and include:  Monitoring the blood clot with ultrasound.  Taking medicines by mouth, such as newer blood thinners (anticoagulants), thrombolytics, or warfarin.  Taking anticoagulant medicine by injection or through an IV tube.  Wearing compression stockings or using different types ofdevices.  Surgery (rare) to remove the blood clot or to place a filter in your abdomen to stop the blood clot from traveling to your lungs.  Treatments for a DVT are often divided into immediate treatment and long-term treatment (up to 3 months after DVT). You can work with your health care provider to choose the treatment program that is best for you. Follow these instructions at home: If you are taking a newer oral anticoagulant:  Take the medicine every single day at the same time each day.  Understand what foods and drugs interact with this medicine.  Understand that there are no regular blood tests required when using this medicine.  Understand the side effects of this medicine, including excessive bruising or bleeding. Ask your health care provider or pharmacist about other possible side effects. If you are taking warfarin:  Understand how to take warfarin and know which foods can affect how warfarin works in Public relations account executiveyour body.  Understand that it is dangerous to take too much or too little warfarin. Too much warfarin increases the risk of bleeding. Too little warfarin continues to allow the risk for blood clots.  Follow your PT and INR blood testing schedule. The PT and INR results allow your health care provider to  adjust your dose of warfarin. It is very important that you have your PT and INR tested as often as told by your health care provider.  Avoid major changes in your diet, or tell your health care provider before you change your diet. Arrange a visit with a registered dietitian to answer your questions. Many foods, especially foods that are high in vitamin K, can interfere with warfarin and affect the PT and INR results. Eat a consistent amount of foods that are high in vitamin K, such as: ? Spinach, kale, broccoli, cabbage, collard greens, turnip greens, Brussels sprouts, peas, cauliflower, seaweed, and parsley. ? Beef liver and pork liver. ? Green tea. ? Soybean oil.  Tell your health care provider about any and all medicines, vitamins, and supplements that you take, including aspirin and other over-the-counter anti-inflammatory medicines. Be especially cautious with aspirin and anti-inflammatory medicines. Do not take those before you ask your health care provider if it is safe to do so. This is  important because many medicines can interfere with warfarin and affect the PT and INR results.  Do not start or stop taking any over-the-counter or prescription medicine unless your health care provider or pharmacist tells you to do so. If you take warfarin, you will also need to do these things:  Hold pressure over cuts for longer than usual.  Tell your dentist and other health care providers that you are taking warfarin before you have any procedures in which bleeding may occur.  Avoid alcohol or drink very small amounts. Tell your health care provider if you change your alcohol intake.  Do not use tobacco products, including cigarettes, chewing tobacco, and e-cigarettes. If you need help quitting, ask your health care provider.  Avoid contact sports.  General instructions  Take over-the-counter and prescription medicines only as told by your health care provider. Anticoagulant medicines can have  side effects, including easy bruising and difficulty stopping bleeding. If you are prescribed an anticoagulant, you will also need to do these things: ? Hold pressure over cuts for longer than usual. ? Tell your dentist and other health care providers that you are taking anticoagulants before you have any procedures in which bleeding may occur. ? Avoid contact sports.  Wear a medical alert bracelet or carry a medical alert card that says you have had a PE.  Ask your health care provider how soon you can go back to your normal activities. Stay active to prevent new blood clots from forming.  Make sure to exercise while traveling or when you have been sitting or standing for a long period of time. It is very important to exercise. Exercise your legs by walking or by tightening and relaxing your leg muscles often. Take frequent walks.  Wear compression stockings as told by your health care provider to help prevent more blood clots from forming.  Do not use tobacco products, including cigarettes, chewing tobacco, and e-cigarettes. If you need help quitting, ask your health care provider.  Keep all follow-up appointments with your health care provider. This is important. How is this prevented? Take these actions to decrease your risk of developing another DVT:  Exercise regularly. For at least 30 minutes every day, engage in: ? Activity that involves moving your arms and legs. ? Activity that encourages good blood flow through your body by increasing your heart rate.  Exercise your arms and legs every hour during long-distance travel (over 4 hours). Drink plenty of water and avoid drinking alcohol while traveling.  Avoid sitting or lying in bed for long periods of time without moving your legs.  Maintain a weight that is appropriate for your height. Ask your health care provider what weight is healthy for you.  If you are a woman who is over 18 years of age, avoid unnecessary use of medicines  that contain estrogen. These include birth control pills.  Do not smoke, especially if you take estrogen medicines. If you need help quitting, ask your health care provider.  If you are hospitalized, prevention measures may include:  Early walking after surgery, as soon as your health care provider says that it is safe.  Receiving anticoagulants to prevent blood clots.If you cannot take anticoagulants, other options may be available, such as wearing compression stockings or using different types of devices.  Get help right away if:  You have new or increased pain, swelling, or redness in an arm or leg.  You have numbness or tingling in an arm or leg.  You have  shortness of breath while active or at rest.  You have chest pain.  You have a rapid or irregular heartbeat.  You feel light-headed or dizzy.  You cough up blood.  You notice blood in your vomit, bowel movement, or urine. These symptoms may represent a serious problem that is an emergency. Do not wait to see if the symptoms will go away. Get medical help right away. Call your local emergency services (911 in the [place] Do not drive yourself to the hospital. This information is not intended to replace advice given to you by your health care provider. Make sure you discuss any questions you have with your health care provider. Document Released: 08/22/2005 Document Revised: 01/28/2016 Document Reviewed: 12/17/2014 Elsevier Interactive Patient Education  2017 Elsevier Inc.   Apixaban oral tablets What is this medicine? APIXABAN (a PIX a ban) is an anticoagulant (blood thinner). It is used to lower the chance of stroke in people with a medical condition called atrial fibrillation. It is also used to treat or prevent blood clots in the lungs or in the veins. This medicine may be used for other purposes; ask your health care provider or pharmacist if you have questions. COMMON BRAND NAME(S): Eliquis What should I tell my  health care provider before I take this medicine? They need to know if you have any of these conditions: -bleeding disorders -bleeding in the brain -blood in your stools (black or tarry stools) or if you have blood in your vomit -history of stomach bleeding -kidney disease -liver disease -mechanical heart valve -an unusual or allergic reaction to apixaban, other medicines, foods, dyes, or preservatives -pregnant or trying to get pregnant -breast-feeding How should I use this medicine? Take this medicine by mouth with a glass of water. Follow the directions on the prescription label. You can take it with or without food. If it upsets your stomach, take it with food. Take your medicine at regular intervals. Do not take it more often than directed. Do not stop taking except on your doctor's advice. Stopping this medicine may increase your risk of a blot clot. Be sure to refill your prescription before you run out of medicine. Talk to your pediatrician regarding the use of this medicine in children. Special care may be needed. Overdosage: If you think you have taken too much of this medicine contact a poison control center or emergency room at once. NOTE: This medicine is only for you. Do not share this medicine with others. What if I miss a dose? If you miss a dose, take it as soon as you can. If it is almost time for your next dose, take only that dose. Do not take double or extra doses. What may interact with this medicine? This medicine may interact with the following: -aspirin and aspirin-like medicines -certain medicines for fungal infections like ketoconazole and itraconazole -certain medicines for seizures like carbamazepine and phenytoin -certain medicines that treat or prevent blood clots like warfarin, enoxaparin, and dalteparin -clarithromycin -NSAIDs, medicines for pain and inflammation, like ibuprofen or naproxen -rifampin -ritonavir -St. John's wort This list may not describe  all possible interactions. Give your health care provider a list of all the medicines, herbs, non-prescription drugs, or dietary supplements you use. Also tell them if you smoke, drink alcohol, or use illegal drugs. Some items may interact with your medicine. What should I watch for while using this medicine? Visit your doctor or health care professional for regular checks on your progress. Notify your doctor or  health care professional and seek emergency treatment if you develop breathing problems; changes in vision; chest pain; severe, sudden headache; pain, swelling, warmth in the leg; trouble speaking; sudden numbness or weakness of the face, arm or leg. These can be signs that your condition has gotten worse. If you are going to have surgery or other procedure, tell your doctor that you are taking this medicine. What side effects may I notice from receiving this medicine? Side effects that you should report to your doctor or health care professional as soon as possible: -allergic reactions like skin rash, itching or hives, swelling of the face, lips, or tongue -signs and symptoms of bleeding such as bloody or black, tarry stools; red or dark-brown urine; spitting up blood or brown material that looks like coffee grounds; red spots on the skin; unusual bruising or bleeding from the eye, gums, or nose This list may not describe all possible side effects. Call your doctor for medical advice about side effects. You may report side effects to FDA at 1-800-FDA-1088. Where should I keep my medicine? Keep out of the reach of children. Store at room temperature between 20 and 25 degrees C (68 and 77 degrees F). Throw away any unused medicine after the expiration date. NOTE: This sheet is a summary. It may not cover all possible information. If you have questions about this medicine, talk to your doctor, pharmacist, or health care provider.  2018 Elsevier/Gold Standard (2016-03-14 11:54:23)

## 2017-05-18 ENCOUNTER — Encounter: Payer: Self-pay | Admitting: Family Medicine

## 2017-05-18 ENCOUNTER — Ambulatory Visit: Payer: Medicare Other | Admitting: Family Medicine

## 2017-05-18 DIAGNOSIS — K5730 Diverticulosis of large intestine without perforation or abscess without bleeding: Secondary | ICD-10-CM | POA: Insufficient documentation

## 2017-05-31 ENCOUNTER — Encounter: Payer: Self-pay | Admitting: Family Medicine

## 2017-05-31 ENCOUNTER — Ambulatory Visit (INDEPENDENT_AMBULATORY_CARE_PROVIDER_SITE_OTHER): Payer: Medicare Other | Admitting: Family Medicine

## 2017-05-31 ENCOUNTER — Ambulatory Visit (INDEPENDENT_AMBULATORY_CARE_PROVIDER_SITE_OTHER): Payer: Medicare Other

## 2017-05-31 VITALS — BP 132/78 | HR 71

## 2017-05-31 DIAGNOSIS — S8265XA Nondisplaced fracture of lateral malleolus of left fibula, initial encounter for closed fracture: Secondary | ICD-10-CM | POA: Diagnosis not present

## 2017-05-31 DIAGNOSIS — W19XXXD Unspecified fall, subsequent encounter: Secondary | ICD-10-CM | POA: Diagnosis not present

## 2017-05-31 DIAGNOSIS — S82892D Other fracture of left lower leg, subsequent encounter for closed fracture with routine healing: Secondary | ICD-10-CM

## 2017-05-31 DIAGNOSIS — S82892A Other fracture of left lower leg, initial encounter for closed fracture: Secondary | ICD-10-CM

## 2017-05-31 NOTE — Progress Notes (Signed)
   Melanie Wilkinson is a 66 y.o. female who presents to [facility] today for follow-up left ankle fracture. Patient is been seen several times over the past 6 weeks or so for left ankle fracture. She's doing well and ambulating in an ASO brace with only minimal pain. She tolerates the brace well. She denies any fevers or chills nausea vomiting or diarrhea.   Past Medical History:  Diagnosis Date  . Hypertension   . Thyroid disease    No past surgical history on file. Social History  Substance Use Topics  . Smoking status: Former Games developer  . Smokeless tobacco: Never Used  . Alcohol use Yes     ROS:  As above   Medications: Current Outpatient Prescriptions  Medication Sig Dispense Refill  . atorvastatin (LIPITOR) 20 MG tablet Take 1 tablet (20 mg total) by mouth daily. 90 tablet 3  . levothyroxine (SYNTHROID, LEVOTHROID) 50 MCG tablet Take 1 tablet (50 mcg total) by mouth daily. 90 tablet 3   No current facility-administered medications for this visit.    No Known Allergies   Exam:  BP 132/78   Pulse 71  General: Well Developed, well nourished, and in no acute distress.  Neuro/Psych: Alert and oriented x3, extra-ocular muscles intact, able to move all 4 extremities, sensation grossly intact. Skin: Warm and dry, no rashes noted.  Respiratory: Not using accessory muscles, speaking in full sentences, trachea midline.  Cardiovascular: Pulses palpable, no extremity edema. Abdomen: Does not appear distended. MSK: Left ankle: Well-appearing nontender normal motion.   X-ray left ankle healing fracture distal fibula. Awaiting formal radiology review    No results found for this or any previous visit (from the past 48 hour(s)). No results found.    Assessment and Plan: 66 y.o. female with healing ankle fracture. Plan to transition out of ASO brace with normal activities. Use the brace with heavy-duty walking or walking on unstable  terrain. Recheck in one month.    Orders Placed This Encounter  Procedures  . DG Ankle Complete Left    Standing Status:   Future    Standing Expiration Date:   07/31/2018    Order Specific Question:   Reason for Exam (SYMPTOM  OR DIAGNOSIS REQUIRED)    Answer:   eval fx    Order Specific Question:   Preferred imaging location?    Answer:   [facility]    Order Specific Question:   Radiology Contrast Protocol - do NOT remove file path    Answer:   [email or web address]   No orders of the defined types were placed in this encounter.   Discussed warning signs or symptoms. Please see discharge instructions. Patient expresses understanding.  I spent 15 minutes with this patient, greater than 50% was face-to-face time counseling regarding treatment plan.

## 2017-05-31 NOTE — Patient Instructions (Signed)
Thank you for coming in today. Use the brace with lots of walking or on uneven ground or gravel or sand.  Recheck with me in 1 month or sooner if needed.  Return to higher levels of immobilization based on pain.

## 2017-06-02 ENCOUNTER — Encounter: Payer: Self-pay | Admitting: Family Medicine

## 2017-06-30 ENCOUNTER — Encounter: Payer: Self-pay | Admitting: Family Medicine

## 2017-06-30 ENCOUNTER — Ambulatory Visit (INDEPENDENT_AMBULATORY_CARE_PROVIDER_SITE_OTHER): Payer: Medicare Other | Admitting: Family Medicine

## 2017-06-30 ENCOUNTER — Ambulatory Visit (INDEPENDENT_AMBULATORY_CARE_PROVIDER_SITE_OTHER): Payer: Medicare Other

## 2017-06-30 VITALS — BP 129/81 | HR 74 | Wt 151.0 lb

## 2017-06-30 DIAGNOSIS — Z1239 Encounter for other screening for malignant neoplasm of breast: Secondary | ICD-10-CM

## 2017-06-30 DIAGNOSIS — S82892A Other fracture of left lower leg, initial encounter for closed fracture: Secondary | ICD-10-CM | POA: Diagnosis not present

## 2017-06-30 DIAGNOSIS — S8262XG Displaced fracture of lateral malleolus of left fibula, subsequent encounter for closed fracture with delayed healing: Secondary | ICD-10-CM | POA: Diagnosis not present

## 2017-06-30 DIAGNOSIS — M25572 Pain in left ankle and joints of left foot: Secondary | ICD-10-CM | POA: Diagnosis not present

## 2017-06-30 DIAGNOSIS — Z1231 Encounter for screening mammogram for malignant neoplasm of breast: Secondary | ICD-10-CM

## 2017-06-30 DIAGNOSIS — W19XXXD Unspecified fall, subsequent encounter: Secondary | ICD-10-CM | POA: Diagnosis not present

## 2017-06-30 NOTE — Patient Instructions (Signed)
Thank you for coming in today. Continue home exercises.  If not getting better we will do PT or a bone simulator.    Ankle Sprain, Phase II Rehab Ask your health care provider which exercises are safe for you. Do exercises exactly as told by your health care provider and adjust them as directed. It is normal to feel mild stretching, pulling, tightness, or discomfort as you do these exercises, but you should stop right away if you feel sudden pain or your pain gets worse.Do not begin these exercises until told by your health care provider. Stretching and range of motion exercises These exercises warm up your muscles and joints and improve the movement and flexibility of your lower leg and ankle. These exercises also help to relieve pain and stiffness. Exercise A: Gastroc stretch, standing  1. Stand with your hands against a wall. 2. Extend your left / right leg behind you, and bend your front knee slightly. Your heels should be on the floor. 3. Keeping your heels on the floor and your back knee straight, shift your weight toward the wall. You should feel a gentle stretch in the back of your lower leg (calf). 4. Hold this position for __________ seconds. Repeat __________ times. Complete this exercise __________ times a day. Exercise B: Soleus stretch, standing 1. Stand with your hands against a wall. 2. Extend your left / right leg behind you, and bend your front knee slightly. Both of your heels should be on the floor. 3. Keeping your heels on the floor, bend your back knee and shift your weight slightly over your back leg. You should feel a gentle stretch deep in your calf. 4. Hold this position for __________ seconds. Repeat __________ times. Complete this exercise __________ times a day. Strengthening exercises These exercises build strength and endurance in your lower leg. Endurance is the ability to use your muscles for a long time, even after they get tired. Exercise C: Heel walking  ( dorsiflexion) Walk on your heels for __________ seconds or ___________ ft. Keep your toes as high as possible. Repeat __________ times. Complete this exercise __________ times a day. Balance exercises These exercises improve your balance and the reaction and control of your ankle to help improve stability. Exercise D: Multi-angle lunge 1. Stand with your feet together. 2. Take a step forward with your left / right leg, and shift your weight onto that leg. Your back heel will come off the floor, and your back toes will stay in place. 3. Push off your front leg to return your front foot to the starting position next to your other foot. 4. Repeat to the side, to the back, and any other directions as told by your health care provider. Repeat in each direction __________ times. Complete this exercise __________ times a day. Exercise E: Single leg stand 1. Without shoes, stand near a railing or in a door frame. Hold onto the railing or door frame as needed. 2. Stand on your left / right foot. Keep your big toe down on the floor and try to keep your arch lifted. 3. Hold this position for __________ seconds. Repeat __________ times. Complete this exercise __________ times a day. If this exercise is too easy, you can try it with your eyes closed or while standing on a pillow. Exercise F: Inversion/eversion  You will need a balance board for this exercise. Ask your health care provider where you can get a balance board or how you can make one. 1. Stand  on a non-carpeted surface near a countertop or wall. 2. Step onto the balance board so your feet are hip-width apart. 3. Keep your feet in place and keep your upper body and hips steady. Using only your feet and ankles to move the board, do one or both of the following exercises as told by your health care provider: ? Tip the board side to side as far as you can, alternating between tipping to the left and tipping to the right. If you can, tip the  board so it silently taps the floor. Do not let the board forcefully hit the floor. From time to time, pause to hold a steady position. ? Tip the board side to side so the board does not hit the floor at all. From time to time, pause to hold a steady position. Repeat the movement for each exercise __________ times. Complete each exercise __________ times a day. Exercise G: Plantar flexion/dorsiflexion  You will need a balance board for this exercise. Ask your health care provider where you can get a balance board or how you can make one. 1. Stand on a non-carpeted surface near a countertop or wall. 2. Step onto the balance board so your feet are hip-width apart. 3. Keep your feet in place and keep your upper body and hips steady. Using only your feet and ankles to move the board, do one or both of the following exercises as told by your health care provider: ? Tip the board forward and backward so the board silently taps the floor. Do not let the board forcefully hit the floor. From time to time, pause to hold a steady position. ? Tip the board forward and backward so the board does not hit the floor at all. From time to time, pause to hold a steady position. Repeat the movement for each exercise __________ times. Complete each exercise __________ times a day. This information is not intended to replace advice given to you by your health care provider. Make sure you discuss any questions you have with your health care provider. Document Released: 12/12/2005 Document Revised: 04/28/2016 Document Reviewed: 07/06/2015 Elsevier Interactive Patient Education  2018 [employer].

## 2017-06-30 NOTE — Progress Notes (Signed)
   Melanie Wilkinson is a 66 y.o. female who [facility] today for follow up left ankle fracture.   Mirah suffered a fracture to her left lateral malleolus on Aug 2nd 2018. She clinically is doing well with only minimal discomfort with exercise. However when checked 1 month ago she did not have much bone healing seen on xray. She notes minimal hesitancy with going down stairs. Overall she is happy with how things are going.    Past Medical History:  Diagnosis Date  . Hypertension   . Thyroid disease    No past surgical history on file. Social History  Substance Use Topics  . Smoking status: Former Games developermoker  . Smokeless tobacco: Never Used  . Alcohol use Yes     ROS:  As above   Medications: Current Outpatient Prescriptions  Medication Sig Dispense Refill  . atorvastatin (LIPITOR) 20 MG tablet Take 1 tablet (20 mg total) by mouth daily. 90 tablet 3  . levothyroxine (SYNTHROID, LEVOTHROID) 50 MCG tablet Take 1 tablet (50 mcg total) by mouth daily. 90 tablet 3   No current facility-administered medications for this visit.    No Known Allergies   Exam:  BP 129/81   Pulse 74   Wt 151 lb (68.5 kg)   BMI 26.75 kg/m  General: Well Developed, well nourished, and in no acute distress.  Neuro/Psych: Alert and oriented x3, extra-ocular muscles intact, able to move all 4 extremities, sensation grossly intact. Skin: Warm and dry, no rashes noted.  Respiratory: Not using accessory muscles, speaking in full sentences, trachea midline.  Cardiovascular: Pulses palpable, no extremity edema. Abdomen: Does not appear distended. MSK: left ankle normal appearing.  Normal motion.  Non-tender.  Stable ligamentous exam.  Pulses, sensation and cap refill intact.   Xray ankle left: Stable appearing with minimal bone healing seen Awaiting formal radiology review.     No results found for this or any previous visit (from the past 48  hour(s)). No results found.    Assessment and Plan: 66 y.o. female with ankle fracture doing clinically very well.  We had a discussion about treatment options. I think a bone stimulator would not be very helpful at this point.  PT and home exercise are likely to be more beneficial.  Plan for home exercises and recheck PRN.     Orders Placed This Encounter  Procedures  . DG Ankle Complete Left    Standing Status:   Future    Standing Expiration Date:   08/30/2018    Order Specific Question:   Reason for Exam (SYMPTOM  OR DIAGNOSIS REQUIRED)    Answer:   follow up fracture    Order Specific Question:   Preferred imaging location?    Answer:   [facility]    Order Specific Question:   Radiology Contrast Protocol - do NOT remove file path    Answer:   [email or web address]   No orders of the defined types were placed in this encounter.   Discussed warning signs or symptoms. Please see discharge instructions. Patient expresses understanding.

## 2017-07-14 ENCOUNTER — Ambulatory Visit (INDEPENDENT_AMBULATORY_CARE_PROVIDER_SITE_OTHER): Payer: Medicare Other

## 2017-07-14 DIAGNOSIS — Z1231 Encounter for screening mammogram for malignant neoplasm of breast: Secondary | ICD-10-CM

## 2017-07-14 DIAGNOSIS — Z1239 Encounter for other screening for malignant neoplasm of breast: Secondary | ICD-10-CM

## 2018-07-10 IMAGING — DX DG ANKLE COMPLETE 3+V*L*
3 series · 3 of 3 positions shown · non-contrast
Comparison: 05/04/2017

CLINICAL DATA: Fracture follow-up

EXAM:
LEFT ANKLE COMPLETE - 3+ VIEW

[ankle ap]
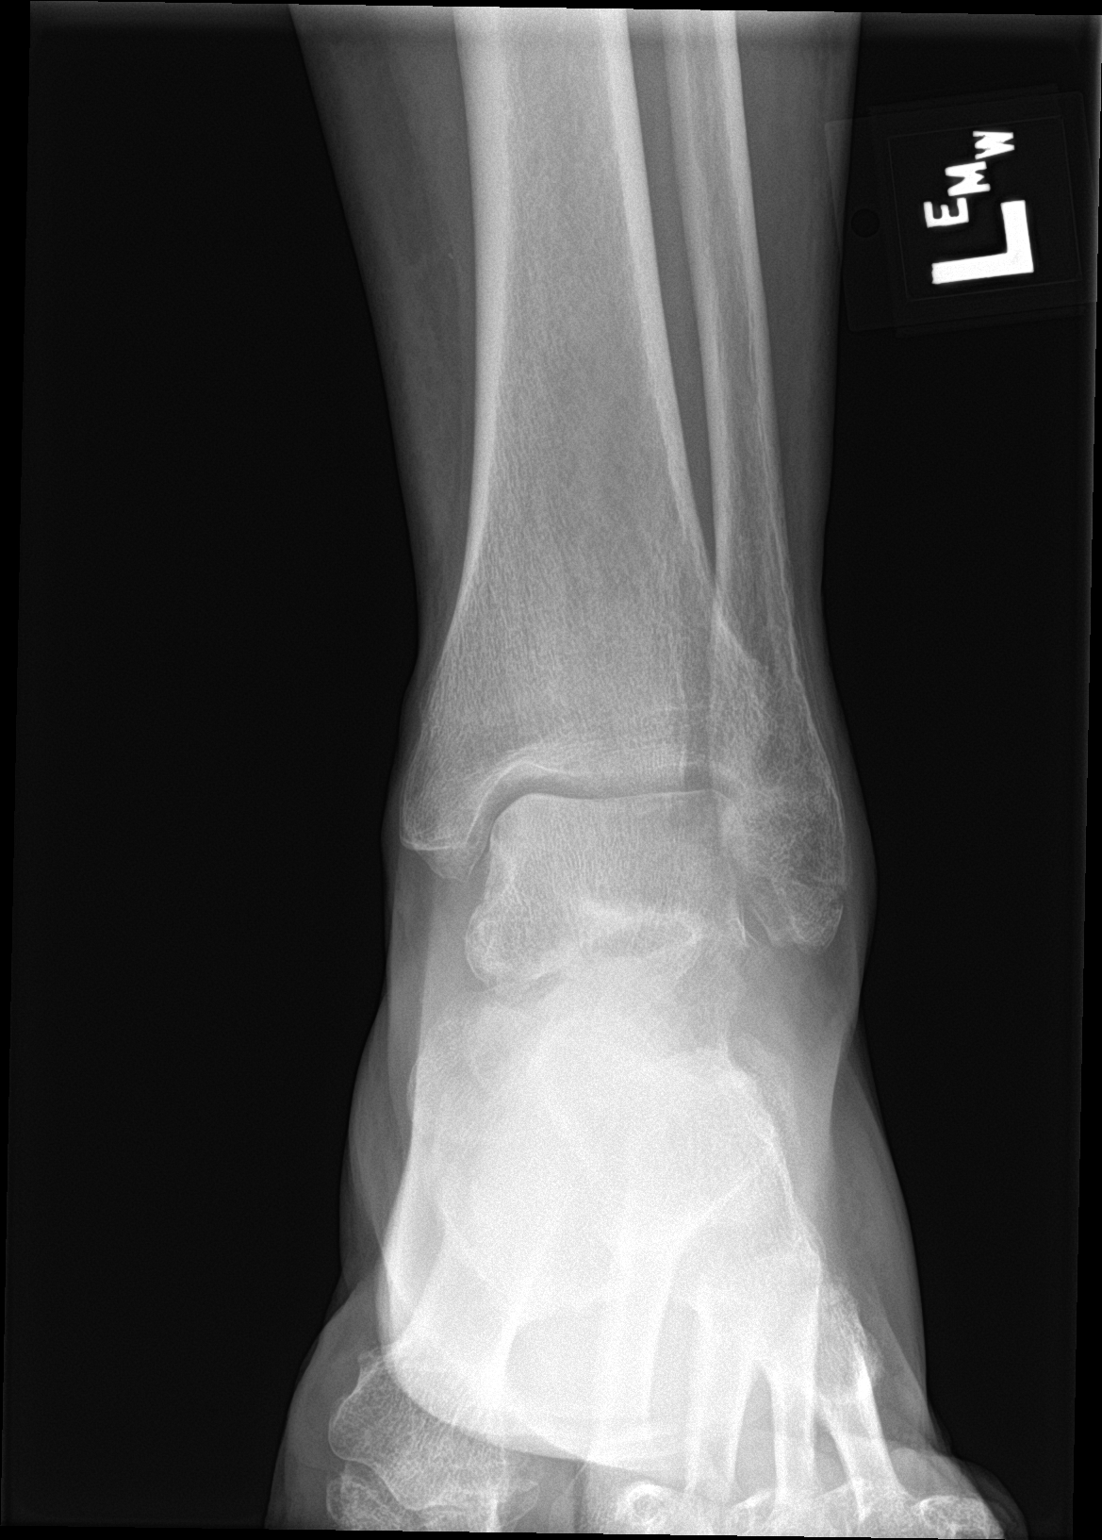

[ankle obl]
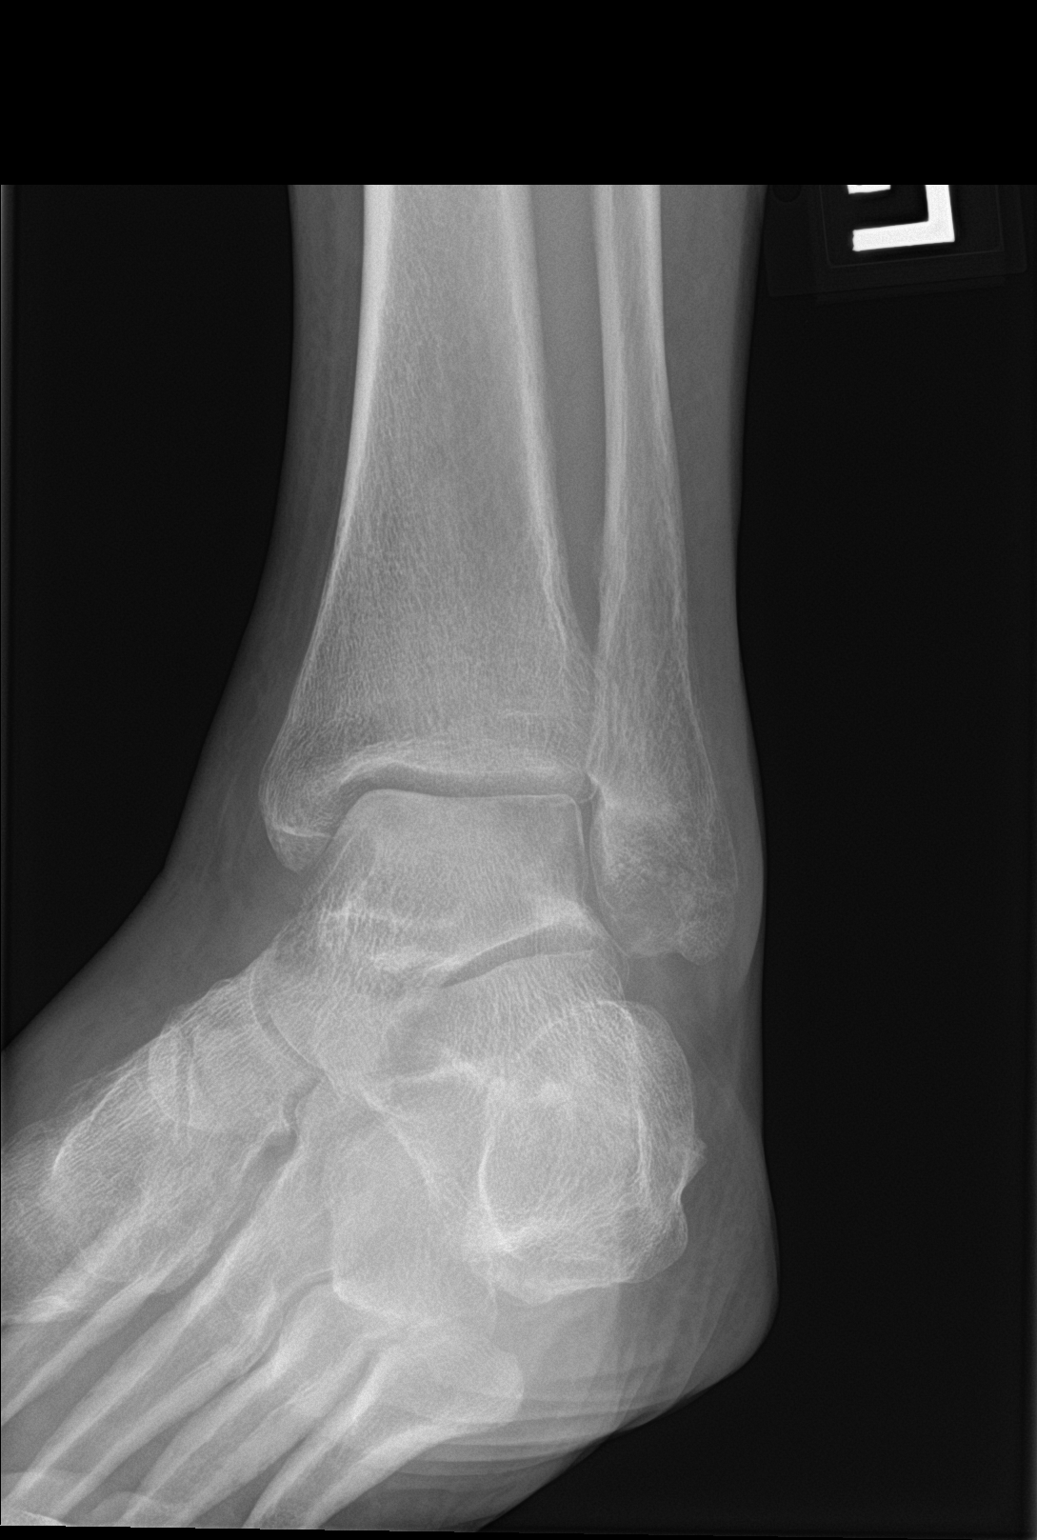

[ankle lat]
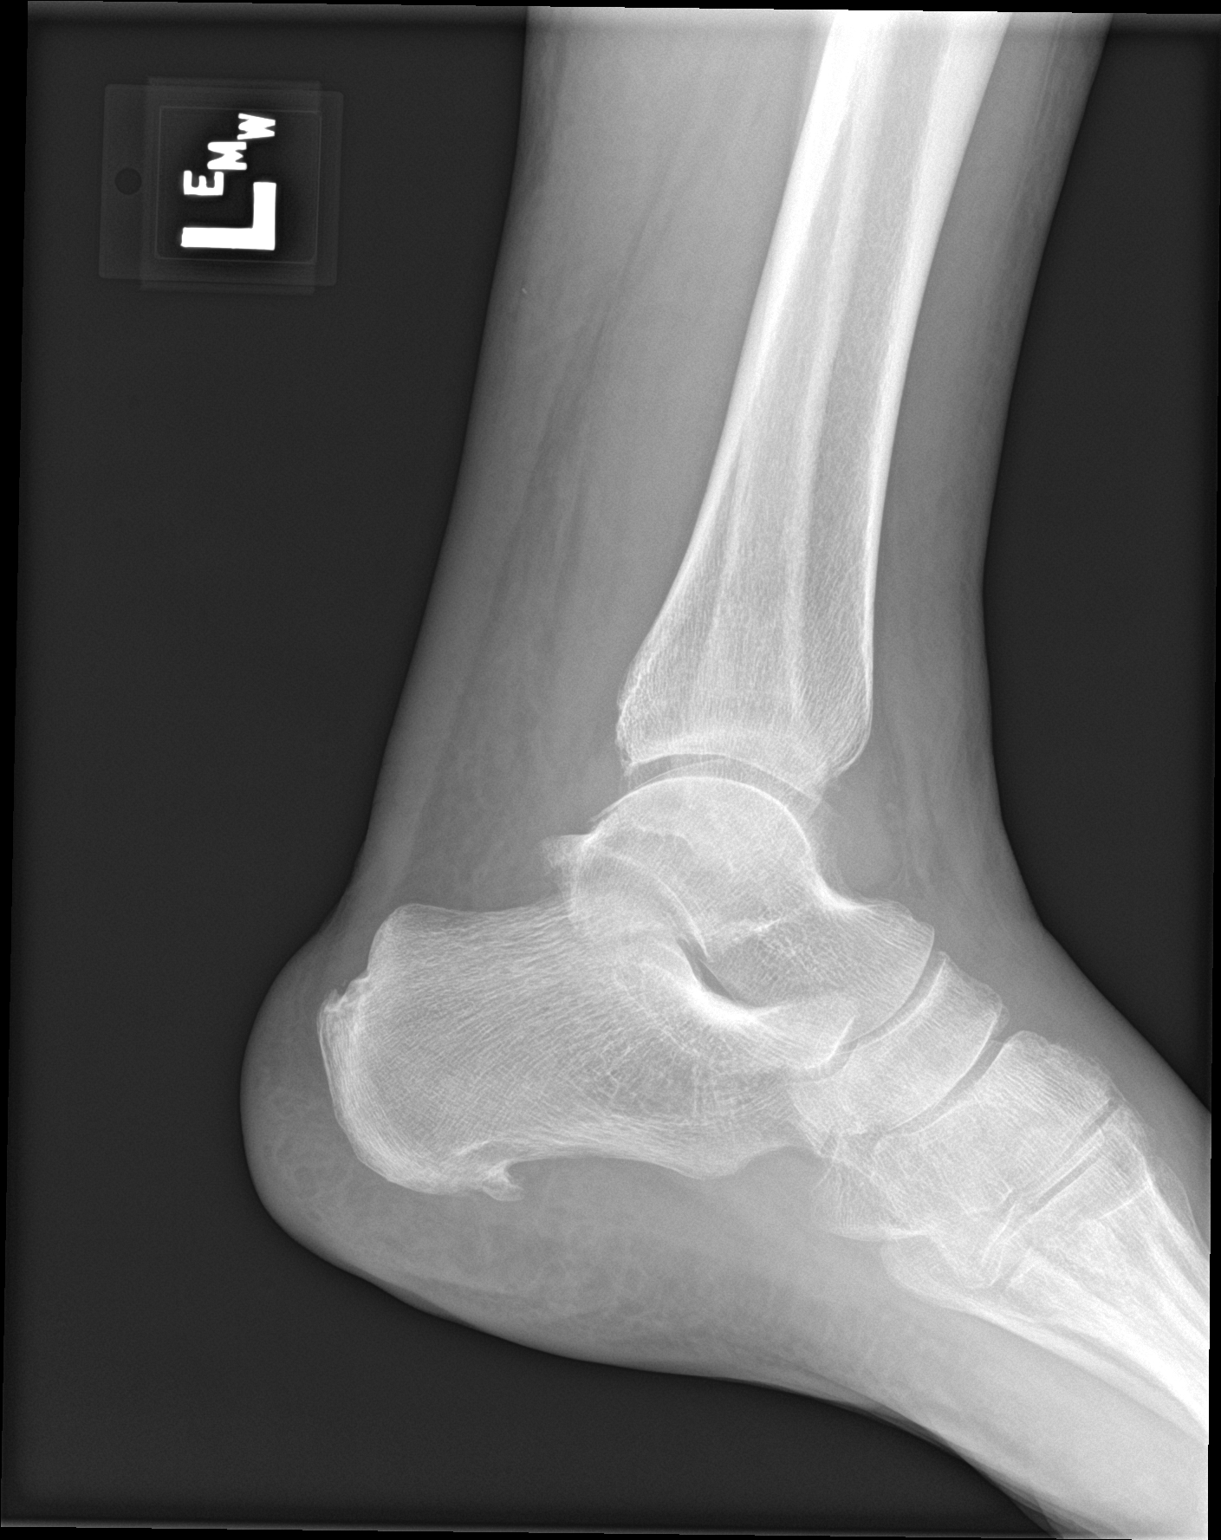

[3 of 3 positions shown; findings below may reference images not displayed]

FINDINGS: Nondisplaced fracture distal fibula with partial early healing. No
new fracture. Ankle joint normal. Ankle joint effusion.

Calcaneal spurring
IMPRESSION: Partial healing of distal fibular fracture.

## 2018-07-12 ENCOUNTER — Encounter: Payer: Self-pay | Admitting: Family Medicine

## 2018-07-23 ENCOUNTER — Telehealth: Payer: Self-pay | Admitting: Family Medicine

## 2018-07-23 NOTE — Telephone Encounter (Signed)
Poke to patient she stated that she got flu shot on 06/13/18. Chart has been updated. Melanie Wilkinson,CMA

## 2018-07-23 NOTE — Telephone Encounter (Signed)
Left message on patient vm advsing to call us back with information on if and when flu shot was done. Hall Birchard,CMA

## 2018-07-23 NOTE — Telephone Encounter (Signed)
Patient overdue for flu vaccine and general recheck.  Please schedule an appointment. 

## 2018-07-31 LAB — HM MAMMOGRAPHY

## 2018-09-06 IMAGING — MG 2D DIGITAL SCREENING BILATERAL MAMMOGRAM WITH CAD AND ADJUNCT TO
8 series · 9 of 24 positions shown · non-contrast
Comparison: Previous exam(s).

CLINICAL DATA: Screening.

EXAM:
2D DIGITAL SCREENING BILATERAL MAMMOGRAM WITH CAD AND ADJUNCT TOMO

[L MLO]
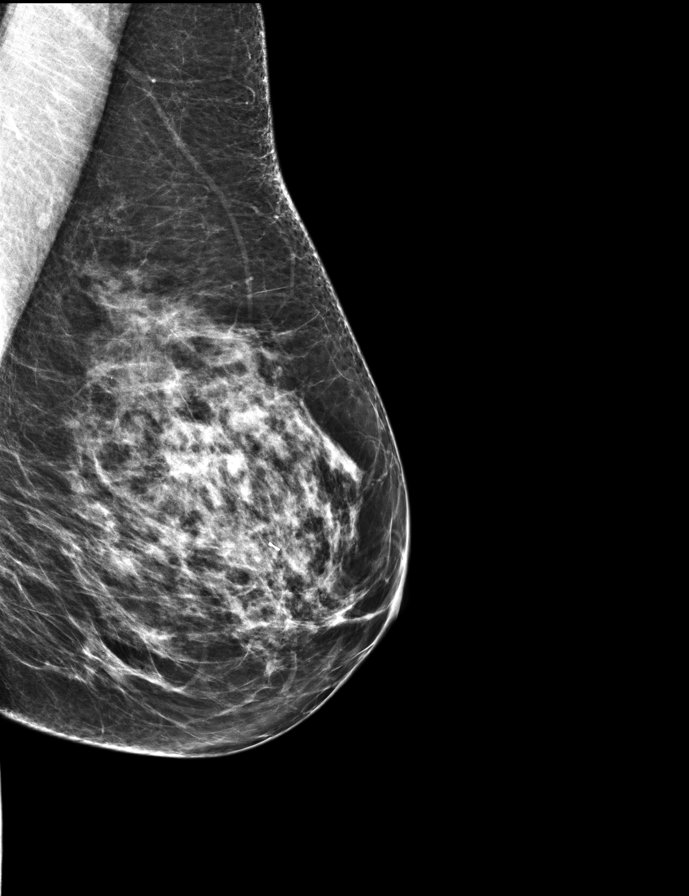

[R MLO]
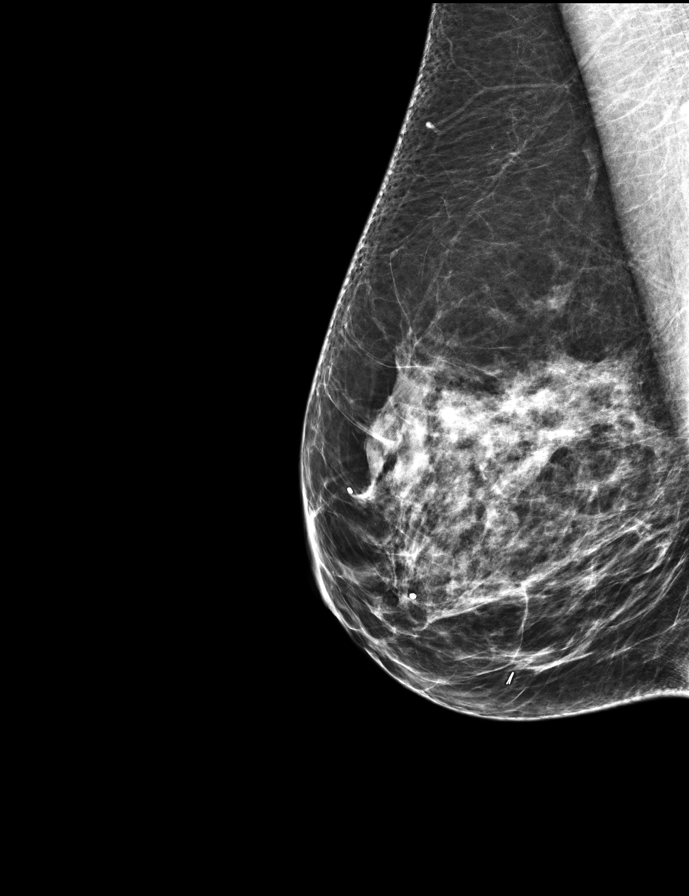

[R CC]
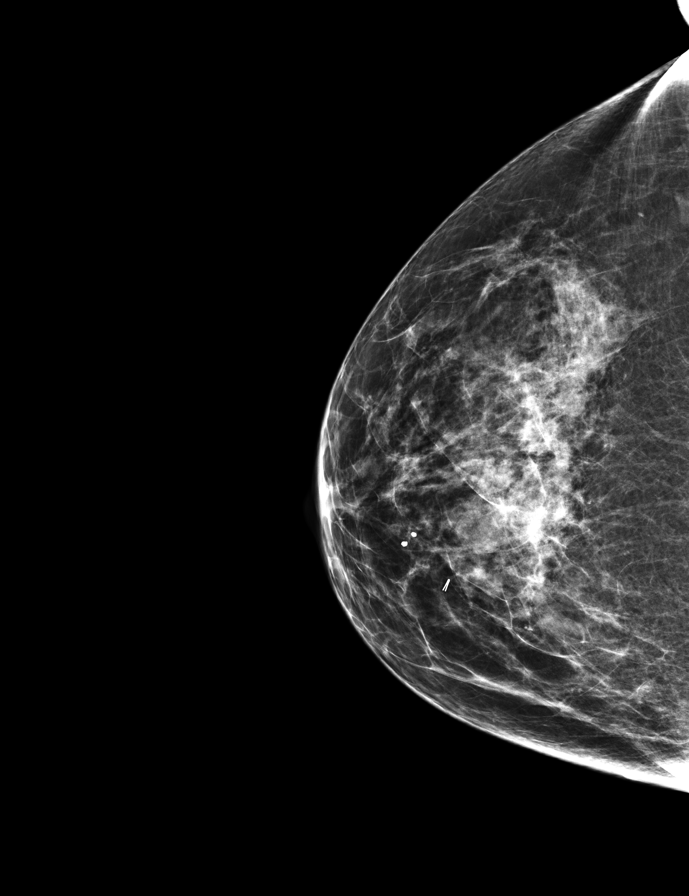

[L CC]
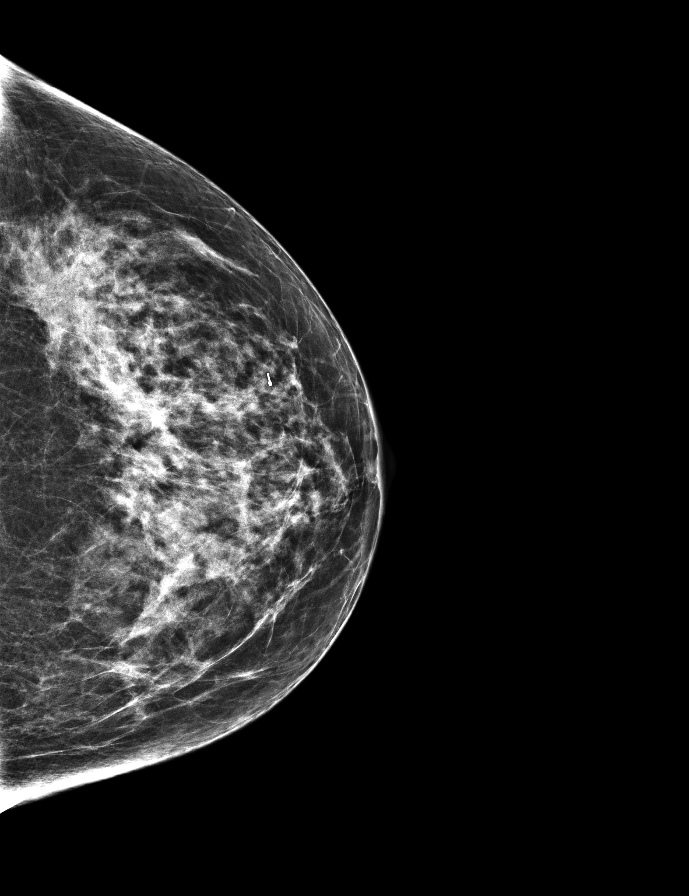

[L MLO tomo · 2 of 75 frames shown]
[frame 25/75]
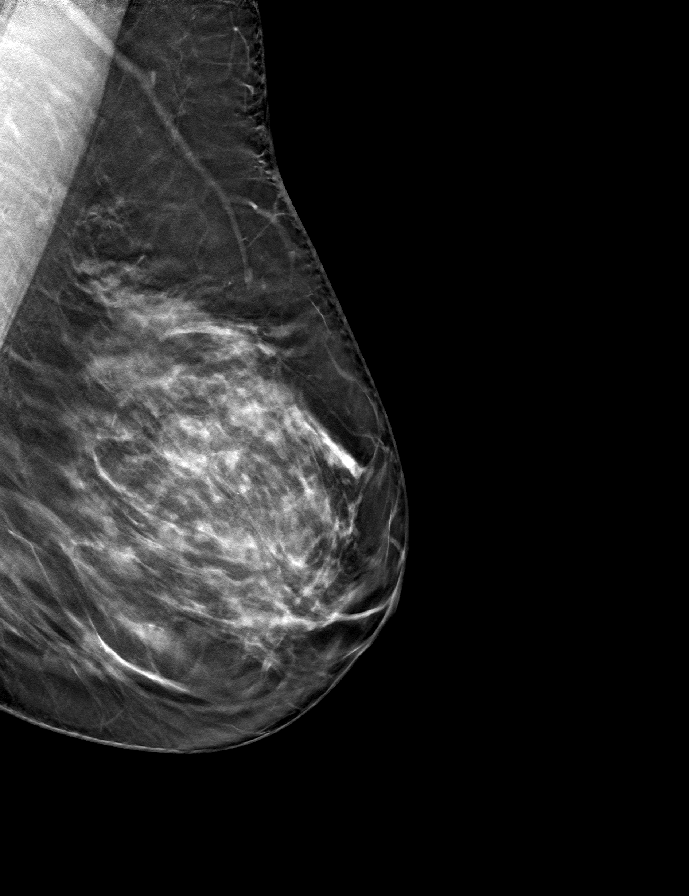
[frame 38/75]
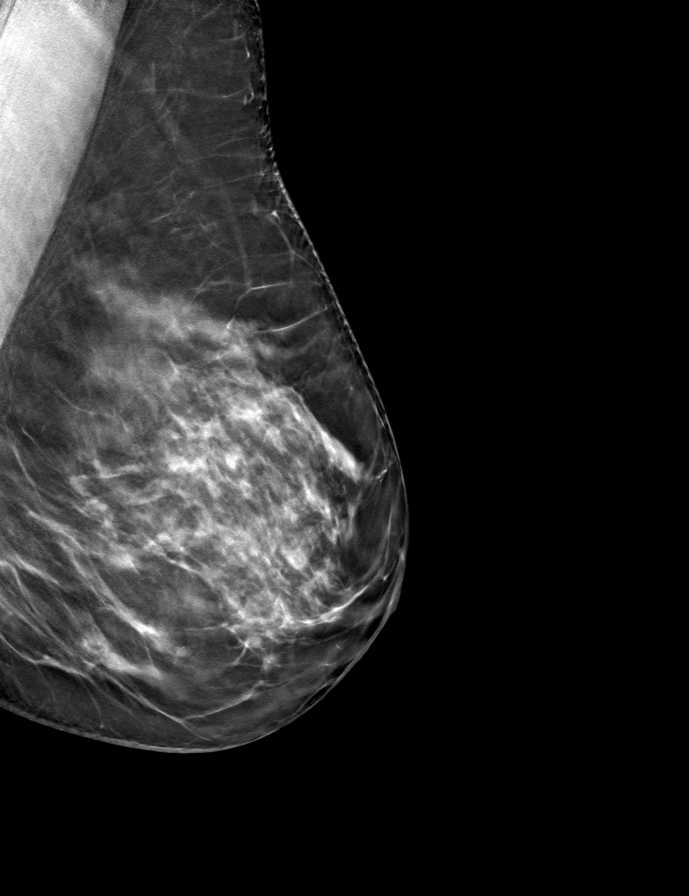

[L CC tomo · tomo slice 34/67.0]
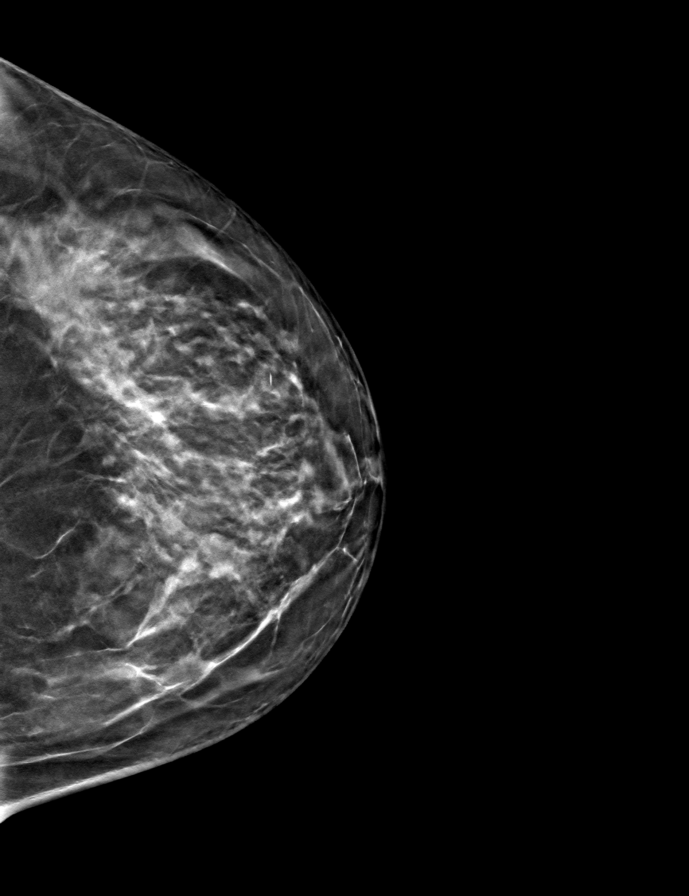

[R CC tomo · tomo slice 37/73.0]
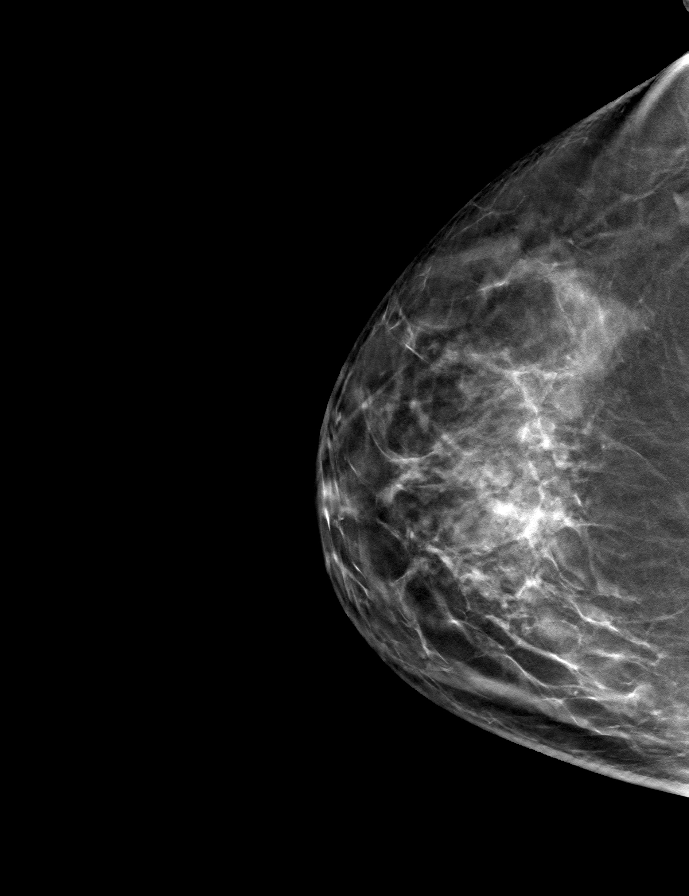

[R MLO tomo · tomo slice 36/71.0]
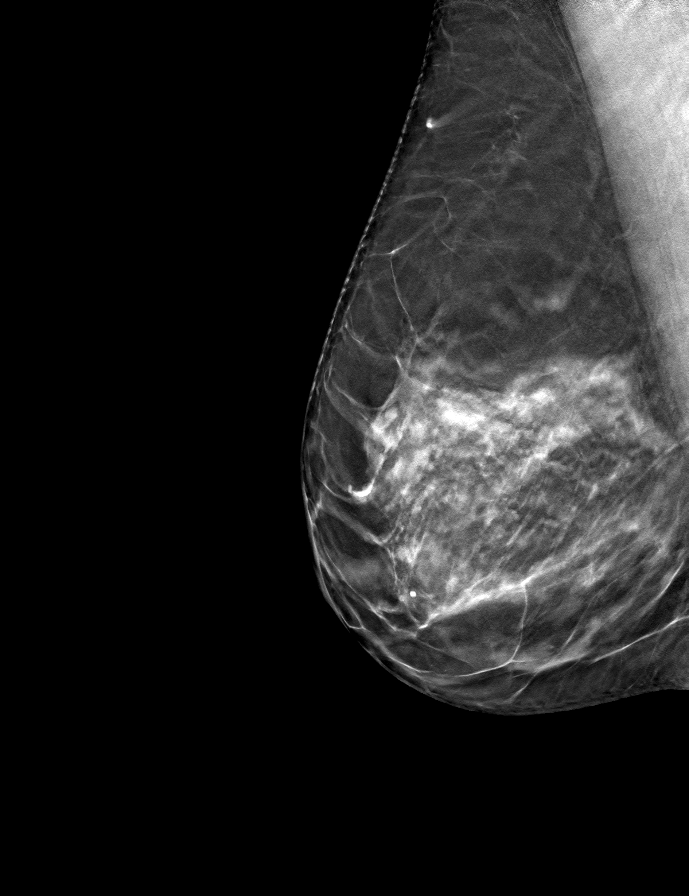

[9 of 24 positions shown; findings below may reference images not displayed]

ACR Breast Density Category c: The breast tissue is heterogeneously
dense, which may obscure small masses.
FINDINGS: There are no findings suspicious for malignancy. Images were
processed with CAD.
IMPRESSION: No mammographic evidence of malignancy. A result letter of this
screening mammogram will be mailed directly to the patient.

RECOMMENDATION:
Screening mammogram in one year. (Code:[id number])

BI-RADS CATEGORY  1: Negative.

## 2019-01-02 ENCOUNTER — Encounter: Payer: Self-pay | Admitting: Family Medicine

## 2019-01-29 ENCOUNTER — Telehealth: Payer: Self-pay | Admitting: Family Medicine

## 2019-01-29 DIAGNOSIS — Z1159 Encounter for screening for other viral diseases: Secondary | ICD-10-CM

## 2019-01-29 DIAGNOSIS — E785 Hyperlipidemia, unspecified: Secondary | ICD-10-CM

## 2019-01-29 DIAGNOSIS — E038 Other specified hypothyroidism: Secondary | ICD-10-CM

## 2019-01-29 DIAGNOSIS — Z0000 Encounter for general adult medical examination without abnormal findings: Secondary | ICD-10-CM

## 2019-01-29 DIAGNOSIS — M8589 Other specified disorders of bone density and structure, multiple sites: Secondary | ICD-10-CM

## 2019-01-29 NOTE — Telephone Encounter (Signed)
Pt advised.

## 2019-01-29 NOTE — Telephone Encounter (Signed)
Labs ordered prior to visit

## 2019-01-29 NOTE — Telephone Encounter (Signed)
Labs pended for PCP review.  

## 2019-01-29 NOTE — Telephone Encounter (Signed)
Patient would like labs ordered prior to her physical on 02/07/2019. Please contact and advise once completed so she can come do them downstairs.

## 2019-02-01 ENCOUNTER — Encounter: Payer: Self-pay | Admitting: Family Medicine

## 2019-02-01 DIAGNOSIS — E038 Other specified hypothyroidism: Secondary | ICD-10-CM

## 2019-02-01 DIAGNOSIS — E785 Hyperlipidemia, unspecified: Secondary | ICD-10-CM

## 2019-02-01 MED ORDER — LEVOTHYROXINE SODIUM 50 MCG PO TABS: 50.0000 ug | ORAL_TABLET | Freq: Every day | ORAL | 0 refills | Status: DC

## 2019-02-01 MED ORDER — ATORVASTATIN CALCIUM 20 MG PO TABS: 20.0000 mg | ORAL_TABLET | Freq: Every day | ORAL | 0 refills | Status: DC

## 2019-02-04 LAB — CBC WITH DIFFERENTIAL/PLATELET
Absolute Monocytes: 380 cells/uL (ref 200–950)
Basophils Absolute: 40 cells/uL (ref 0–200)
Basophils Relative: 1.2 %
Eosinophils Absolute: 59 cells/uL (ref 15–500)
Eosinophils Relative: 1.8 %
HCT: 44.8 % (ref 35.0–45.0)
Hemoglobin: 14.7 g/dL (ref 11.7–15.5)
Lymphs Abs: 1455 cells/uL (ref 850–3900)
MCH: 30.6 pg (ref 27.0–33.0)
MCHC: 32.8 g/dL (ref 32.0–36.0)
MCV: 93.3 fL (ref 80.0–100.0)
MPV: 11.3 fL (ref 7.5–12.5)
Monocytes Relative: 11.5 %
Neutro Abs: 1366 cells/uL — ABNORMAL LOW (ref 1500–7800)
Neutrophils Relative %: 41.4 %
Platelets: 359 10*3/uL (ref 140–400)
RBC: 4.8 10*6/uL (ref 3.80–5.10)
RDW: 12.2 % (ref 11.0–15.0)
Total Lymphocyte: 44.1 %
WBC: 3.3 10*3/uL — ABNORMAL LOW (ref 3.8–10.8)

## 2019-02-04 LAB — COMPLETE METABOLIC PANEL WITH GFR
AG Ratio: 1.8 (calc) (ref 1.0–2.5)
ALT: 23 U/L (ref 6–29)
AST: 22 U/L (ref 10–35)
Albumin: 4.4 g/dL (ref 3.6–5.1)
Alkaline phosphatase (APISO): 48 U/L (ref 37–153)
BUN: 13 mg/dL (ref 7–25)
CO2: 24 mmol/L (ref 20–32)
Calcium: 9.9 mg/dL (ref 8.6–10.4)
Chloride: 106 mmol/L (ref 98–110)
Creat: 0.64 mg/dL (ref 0.60–0.93)
GFR, Est African American: 105 mL/min/{1.73_m2} (ref >=60)
GFR, Est Non African American: 90 mL/min/{1.73_m2} (ref >=60)
Globulin: 2.4 g/dL (calc) (ref 1.9–3.7)
Glucose, Bld: 84 mg/dL (ref 65–99)
Potassium: 4.5 mmol/L (ref 3.5–5.3)
Sodium: 141 mmol/L (ref 135–146)
Total Bilirubin: 0.7 mg/dL (ref 0.2–1.2)
Total Protein: 6.8 g/dL (ref 6.1–8.1)

## 2019-02-04 LAB — LIPID PANEL
Cholesterol: 195 mg/dL (ref <200)
HDL: 65 mg/dL (ref >=50)
LDL Cholesterol (Calc): 102 mg/dL (calc) — ABNORMAL HIGH
Non-HDL Cholesterol (Calc): 130 mg/dL (calc) — ABNORMAL HIGH (ref <130)
Total CHOL/HDL Ratio: 3 (calc) (ref <5.0)
Triglycerides: 164 mg/dL — ABNORMAL HIGH (ref <150)

## 2019-02-04 LAB — HEPATITIS C ANTIBODY
Hepatitis C Ab: NONREACTIVE
SIGNAL TO CUT-OFF: 0.01 (ref <1.00)

## 2019-02-04 LAB — TSH: TSH: 2.93 mIU/L (ref 0.40–4.50)

## 2019-02-04 NOTE — Progress Notes (Signed)
Subjective:   Melanie Wilkinson is a 68 y.o. female who presents for an Initial Medicare Annual Wellness Visit.  Review of Systems    No ROS.  Medicare Wellness Virtual Visit.  Visual/audio telehealth visit, UTA vital signs.   See social history for additional risk factors.     Cardiac Risk Factors include: none Sleep patterns: Getting 7 hours of sleep a night. Does not wake up during night to go to the bathroom. Wakes up feeling refreshed and ready for the day Home Safety/Smoke Alarms: Feels safe in home. Smoke alarms in place.  Living environment; Lives with husband in a 2 story home and stairs have hand rails in place. Shower is a walk in shower with bench seat in place. Seat Belt Safety/Bike Helmet: Wears seat belt.   Female:   Pap-  Aged out     Mammo- UTD      Dexa scan- UTD       [facility]- will schedule once pandemic is over     Objective:    Today's Vitals   02/05/19 1421  Weight: 144 lb (65.3 kg)  Height: 5\' 4"  (1.626 m)   Body mass index is 24.72 kg/m.  Advanced Directives 02/05/2019 04/06/2016  Does Patient Have a Medical Advance Directive? Yes Yes  Type of Estate agentAdvance Directive Healthcare Power of [place];Living will Living will  Does patient want to make changes to medical advance directive? No - Patient declined No - Patient declined  Copy of Healthcare Power of Attorney in Chart? No - copy requested No - copy requested    Current Medications (verified) Outpatient Encounter Medications as of 02/05/2019  Medication Sig  . atorvastatin (LIPITOR) 20 MG tablet Take 1 tablet (20 mg total) by mouth daily.  Marland Kitchen. levothyroxine (SYNTHROID) 50 MCG tablet Take 1 tablet (50 mcg total) by mouth daily.   No facility-administered encounter medications on file as of 02/05/2019.     Allergies (verified) Patient has no known allergies.   History: Past Medical History:  Diagnosis Date  . Fibrocystic breast   . Hypertension   . Thyroid disease    Past Surgical History:  Procedure  Laterality Date  . BREAST BIOPSY     Family History  Problem Relation Age of Onset  . Hypertension Mother   . Hypertension Father    Social History   Socioeconomic History  . Marital status: Married    Spouse name: Merlyn AlbertFred  . Number of children: 2  . Years of education: 6012  . Highest education level: 12th grade  Occupational History  . 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 Financial resource strain: Not hard at all  . Food insecurity:    Worry: Never true    Inability: Never true  . Transportation needs:    Medical: No    Non-medical: No  Tobacco Use  . Smoking status: Former Games developermoker  . Smokeless tobacco: Never Used  Substance and Sexual Activity  . Alcohol use: Yes    Alcohol/week: 1.0 standard drinks    Types: 1 Glasses of wine per week    Comment: occasional  . Drug use: No  . Sexual activity: Yes  Lifestyle  . Physical activity:    Days per week: 5 days    Minutes per session: 60 min  . Stress: Not at all  Relationships  . Social connections:    Talks on phone: More than three times a week    Gets together: Twice a  week    Attends religious service: More than 4 times per year    Active member of club or organization: Yes    Attends meetings of clubs or organizations: More than 4 times per year    Relationship status: Married  Other Topics Concern  . Not on file  Social History Narrative   Workout then walks   Crafts   Reading   tv watching    Tobacco Counseling Counseling given: No   Clinical Intake:  Pre-visit preparation completed: Yes  Pain : No/denies pain     Nutritional Risks: None Diabetes: No  How often do you need to have someone help you when you read instructions, pamphlets, or other written materials from your doctor or pharmacy?: 1 - Never What is the last grade level you completed in school?: 12  Interpreter Needed?: No  Information entered by :: Carolin Sicks, LPN   Activities of Daily  Living In your present state of health, do you have any difficulty performing the following activities: 02/05/2019  Hearing? N  Vision? N  Difficulty concentrating or making decisions? N  Walking or climbing stairs? N  Dressing or bathing? N  Doing errands, shopping? N  Preparing Food and eating ? N  Using the Toilet? N  In the past six months, have you accidently leaked urine? N  Do you have problems with loss of bowel control? N  Managing your Medications? N  Managing your Finances? N  Housekeeping or managing your Housekeeping? N  Some recent data might be hidden     Immunizations and Health Maintenance Immunization History  Administered Date(s) Administered  . Influenza,inj,Quad PF,6+ Mos 05/04/2017  . Influenza-Unspecified 06/13/2018  . Tdap 05/04/2017   Health Maintenance Due  Topic Date Due  . Hepatitis C Screening  1950/11/18  . COLONOSCOPY  09/16/2018    Patient Care Team: Rodolph Bong, MD as PCP - General (Family Medicine)  Indicate any recent Medical Services you may have received from other than [facility] providers in the past year (date may be approximate).     Assessment:   This is a routine wellness examination for Dorlisa.Physical assessment deferred to PCP.   Hearing/Vision screen Hearing Screening Comments: Hearing test not done, visit was via telephone due to the COVID19 pandemic Vision Screening Comments: Vision test not done, visit done via telephone due to the COVID19 pandemic  Dietary issues and exercise activities discussed: Current Exercise Habits: Home exercise routine, Type of exercise: strength training/weights;walking, Time (Minutes): 60, Frequency (Times/Week): 4, Weekly Exercise (Minutes/Week): 240, Exercise limited by: None identified Diet Eats a healthy diet daily. Drinks water daily Breakfast: smoothie with spinach, fruit and sometimes protein powder Lunch: salad or [place] sandwich Dinner:  Meat and 2 vegetables. Tries to stay away from  starches.     Goals   None    Depression Screen PHQ 2/9 Scores 02/05/2019 04/20/2017  PHQ - 2 Score 0 0    Fall Risk Fall Risk  02/05/2019  Falls in the past year? 0  Follow up Falls prevention discussed    Is the patient's home free of loose throw rugs in walkways, pet beds, electrical cords, etc?   yes      Grab bars in the bathroom? no      Handrails on the stairs?   yes      Adequate lighting?   yes   Cognitive Function:     6CIT Screen 02/05/2019  What Year? 0 points  What month? 0 points  What time? 0 points  Count back from 20 0 points  Months in reverse 0 points    Screening Tests Health Maintenance  Topic Date Due  . Hepatitis C Screening  1951/06/25  . COLONOSCOPY  09/16/2018  . PNA vac Low Risk Adult (1 of 2 - PCV13) 04/06/2024 (Originally 10/08/2013)  . INFLUENZA VACCINE  04/06/2019  . MAMMOGRAM  07/15/2019  . TETANUS/TDAP  05/05/2027  . DEXA SCAN  Completed     Plan:    Please schedule your next medicare wellness visit with me in 1 yr.  Ms. Gadway , Thank you for taking time to come for your Medicare Wellness Visit. I appreciate your ongoing commitment to your health goals. Please review the following plan we discussed and let me know if I can assist you in the future.   These are the goals we discussed: Goals   None     This is a list of the screening recommended for you and due dates:  Health Maintenance  Topic Date Due  .  Hepatitis C: One time screening is recommended by Center for Disease Control  (CDC) for  adults born from [birth year] through 1967.   [birth year]  . Colon Cancer Screening  09/16/2018  . Pneumonia vaccines (1 of 2 - PCV13) 04/06/2024*  . Flu Shot  04/06/2019  . Mammogram  07/15/2019  . Tetanus Vaccine  05/05/2027  . DEXA scan (bone density measurement)  Completed  *Topic was postponed. The date shown is not the original due date.   Continue doing brain stimulating activities (puzzles, reading, adult coloring books, staying active)  to keep memory sharp.  Bring a copy of your living will and/or healthcare power of attorney to your next office visit.   I have personally reviewed and noted the following in the patient's chart:   . Medical and social history . Use of alcohol, tobacco or illicit drugs  . Current medications and supplements . Functional ability and status . Nutritional status . Physical activity . Advanced directives . List of other physicians . Hospitalizations, surgeries, and ER visits in previous 12 months . Vitals . Screenings to include cognitive, depression, and falls . Referrals and appointments  In addition, I have reviewed and discussed with patient certain preventive protocols, quality metrics, and best practice recommendations. A written personalized care plan for preventive services as well as general preventive health recommendations were provided to patient.     Normand Sloop, LPN   09/10/1094

## 2019-02-05 ENCOUNTER — Encounter: Payer: Medicare Other | Admitting: Family Medicine

## 2019-02-05 ENCOUNTER — Ambulatory Visit (INDEPENDENT_AMBULATORY_CARE_PROVIDER_SITE_OTHER): Payer: Medicare HMO | Admitting: *Deleted

## 2019-02-05 VITALS — Ht 64.0 in | Wt 144.0 lb

## 2019-02-05 DIAGNOSIS — Z0000 Encounter for general adult medical examination without abnormal findings: Secondary | ICD-10-CM | POA: Diagnosis not present

## 2019-02-05 NOTE — Patient Instructions (Addendum)
Please schedule your next medicare wellness visit with me in 1 yr.  Melanie Wilkinson , Thank you for taking time to come for your Medicare Wellness Visit. I appreciate your ongoing commitment to your health goals. Please review the following plan we discussed and let me know if I can assist you in the future.   Continue doing brain stimulating activities (puzzles, reading, adult coloring books, staying active) to keep memory sharp.  Bring a copy of your living will and/or healthcare power of attorney to your next office visit.

## 2019-02-07 ENCOUNTER — Encounter: Payer: Self-pay | Admitting: Family Medicine

## 2019-02-07 ENCOUNTER — Ambulatory Visit (INDEPENDENT_AMBULATORY_CARE_PROVIDER_SITE_OTHER): Payer: Medicare HMO | Admitting: Family Medicine

## 2019-02-07 VITALS — BP 138/89 | HR 74 | Temp 97.8°F | Wt 145.0 lb

## 2019-02-07 DIAGNOSIS — E785 Hyperlipidemia, unspecified: Secondary | ICD-10-CM | POA: Diagnosis not present

## 2019-02-07 DIAGNOSIS — Z0000 Encounter for general adult medical examination without abnormal findings: Secondary | ICD-10-CM | POA: Diagnosis not present

## 2019-02-07 DIAGNOSIS — M8589 Other specified disorders of bone density and structure, multiple sites: Secondary | ICD-10-CM

## 2019-02-07 DIAGNOSIS — E559 Vitamin D deficiency, unspecified: Secondary | ICD-10-CM

## 2019-02-07 DIAGNOSIS — Z1211 Encounter for screening for malignant neoplasm of colon: Secondary | ICD-10-CM | POA: Diagnosis not present

## 2019-02-07 DIAGNOSIS — Z6824 Body mass index (BMI) 24.0-24.9, adult: Secondary | ICD-10-CM

## 2019-02-07 DIAGNOSIS — E038 Other specified hypothyroidism: Secondary | ICD-10-CM

## 2019-02-07 MED ORDER — LEVOTHYROXINE SODIUM 50 MCG PO TABS: 50.0000 ug | ORAL_TABLET | Freq: Every day | ORAL | 2 refills | Status: DC

## 2019-02-07 MED ORDER — ATORVASTATIN CALCIUM 20 MG PO TABS: 20.0000 mg | ORAL_TABLET | Freq: Every day | ORAL | 2 refills | Status: DC

## 2019-02-07 NOTE — Patient Instructions (Addendum)
Thank you for coming in today.  Continue exercise.   Continue medicine.  Recheck yearly if all is well.  You should hear soon about colonscopy. Let me know if you do not hear anything.

## 2019-02-07 NOTE — Progress Notes (Signed)
Melanie Wilkinson is a 68 y.o. female who [facility] today for well adult visit.   Melanie Wilkinson is doing well.  She exercises regularly and eats a careful diet and is happy with how things are going.  She takes her medications listed below for hypothyroidism and for hyperlipidemia.  She tolerates them well with no issues.  [facility]  She had colonoscopy about 5 years ago and is due for recheck.  Other family members already go to digestive health specialist she like to have a colonoscopy done there.  She checks her blood pressure at home regularly and is usually 120/70. ROS as above:  Past Medical History:  Diagnosis Date  . Fibrocystic breast   . Hypertension   . Thyroid disease    Past Surgical History:  Procedure Laterality Date  . BREAST BIOPSY     Social History   Tobacco Use  . Smoking status: Former Games developermoker  . Smokeless tobacco: Never Used  Substance Use Topics  . Alcohol use: Yes    Alcohol/week: 1.0 standard drinks    Types: 1 Glasses of wine per week    Comment: occasional   family history includes Hypertension in her father and mother.  Medications: Current Outpatient Medications  Medication Sig Dispense Refill  . cholecalciferol (VITAMIN D3) 25 MCG (1000 UT) tablet Take 1,000 Units by mouth daily.    Melanie Wilkinson. glucosamine-chondroitin 500-400 MG tablet Take 1 tablet by mouth 3 (three) times daily.    . Magnesium Chloride-Calcium (MAG-SR PLUS CALCIUM PO) Take by mouth.    . Multiple Vitamin (MULTIVITAMIN WITH MINERALS) TABS tablet Take 1 tablet by mouth daily.    . Omega-3 Fatty Acids (FISH OIL) 1000 MG CAPS Take by mouth.    Melanie Wilkinson. [START ON 05/04/2019] atorvastatin (LIPITOR) 20 MG tablet Take 1 tablet (20 mg total) by mouth daily. 90 tablet 2  . [START ON 05/04/2019] levothyroxine (SYNTHROID) 50 MCG tablet Take 1 tablet (50 mcg  total) by mouth daily. 90 tablet 2   No current facility-administered medications for this visit.    No Known Allergies  Health Maintenance Health Maintenance  Topic Date Due  . Hepatitis C Screening  [birth year]  . COLONOSCOPY  09/16/2018  . PNA vac Low Risk Adult (1 of 2 - PCV13) 04/06/2024 (Originally 10/08/2013)  . INFLUENZA VACCINE  04/06/2019  . MAMMOGRAM  07/15/2019  . TETANUS/TDAP  05/05/2027  . DEXA SCAN  Completed     Exam:  BP 138/89   Pulse 74   Temp 97.8 F (36.6 C) (Oral)   Wt 145 lb (65.8 kg)   BMI 24.89 kg/m  Wt Readings from Last 5 Encounters:  02/07/19 145 lb (65.8 kg)  02/05/19 144 lb (65.3 kg)  06/30/17 151 lb (68.5 kg)  05/04/17 148 lb (67.1 kg)  04/20/17 149 lb (67.6 kg)      Gen: Well NAD HEENT: EOMI,  MMM Lungs: Normal work of breathing. CTABL Heart: RRR no MRG Abd: NABS, Soft. Nondistended, Nontender Exts: Brisk capillary refill, warm and well perfused.  Psych: Normal speech thought process and affect.  Dep[facility] screen [facility] 02/05/2019 04/20/2017  Decreased Interest 0 0  Down, Depressed, Hopeless 0 0  PHQ - 2 Score 0 0       Lab and Radiology Results Mammogram from 2018 reviewed will be abstracted.   Assessment and Plan: 68 y.o. female with  Well adult.  Doing  well.  Patient exercise regularly.  Weight is healthy.  Patient taking appropriate supplementation for osteopenia.  Blood pressure well managed at home.  Lipids managed and TSH managed.  Plan for continue observational management and recheck in 1 year.  Will refer to gastroenterology for repeat colonoscopy.  Patient is due for her 5-year recheck.  PDMP not reviewed this encounter. Orders Placed This Encounter  Procedures  . Ambulatory referral to Gastroenterology    Referral Priority:   Routine    Referral Type:   Consultation    Referral Reason:   Specialty Services Required    Referred to Provider:   Concepcion Elk, MD    Number of Visits Requested:   1   Meds  ordered this encounter  Medications  . atorvastatin (LIPITOR) 20 MG tablet    Sig: Take 1 tablet (20 mg total) by mouth daily.    Dispense:  90 tablet    Refill:  2  . levothyroxine (SYNTHROID) 50 MCG tablet    Sig: Take 1 tablet (50 mcg total) by mouth daily.    Dispense:  90 tablet    Refill:  2    Okay to switch to generic brands due to recall     Discussed warning signs or symptoms. Please see discharge instructions. Patient expresses understanding.

## 2019-03-14 ENCOUNTER — Encounter: Payer: Self-pay | Admitting: Family Medicine

## 2019-04-01 LAB — HM COLONOSCOPY

## 2019-04-08 ENCOUNTER — Telehealth: Payer: Self-pay | Admitting: Family Medicine

## 2019-04-08 NOTE — Telephone Encounter (Signed)
Patient had colonoscopy.  Date of service was 04/01/2019.  Colonoscopy showed multiple diverticula and a single sessile polyp.  Polyp was hyperplastic.

## 2019-04-09 ENCOUNTER — Encounter: Payer: Self-pay | Admitting: Family Medicine

## 2019-04-20 ENCOUNTER — Other Ambulatory Visit: Payer: Self-pay | Admitting: Family Medicine

## 2019-04-20 DIAGNOSIS — E038 Other specified hypothyroidism: Secondary | ICD-10-CM

## 2019-04-20 DIAGNOSIS — E785 Hyperlipidemia, unspecified: Secondary | ICD-10-CM

## 2019-07-19 ENCOUNTER — Other Ambulatory Visit: Payer: Self-pay | Admitting: Family Medicine

## 2019-07-19 DIAGNOSIS — E785 Hyperlipidemia, unspecified: Secondary | ICD-10-CM

## 2019-07-19 DIAGNOSIS — E038 Other specified hypothyroidism: Secondary | ICD-10-CM

## 2019-07-25 ENCOUNTER — Encounter: Payer: Self-pay | Admitting: Family Medicine

## 2019-07-25 ENCOUNTER — Other Ambulatory Visit: Payer: Self-pay | Admitting: Osteopathic Medicine

## 2019-07-25 DIAGNOSIS — E785 Hyperlipidemia, unspecified: Secondary | ICD-10-CM

## 2019-07-25 DIAGNOSIS — E038 Other specified hypothyroidism: Secondary | ICD-10-CM

## 2019-08-12 ENCOUNTER — Other Ambulatory Visit: Payer: Self-pay | Admitting: Family Medicine

## 2019-08-12 ENCOUNTER — Other Ambulatory Visit (HOSPITAL_BASED_OUTPATIENT_CLINIC_OR_DEPARTMENT_OTHER): Payer: Self-pay | Admitting: Family Medicine

## 2019-08-12 DIAGNOSIS — Z1231 Encounter for screening mammogram for malignant neoplasm of breast: Secondary | ICD-10-CM

## 2019-08-15 ENCOUNTER — Ambulatory Visit (INDEPENDENT_AMBULATORY_CARE_PROVIDER_SITE_OTHER): Payer: Medicare HMO

## 2019-08-15 ENCOUNTER — Other Ambulatory Visit: Payer: Self-pay

## 2019-08-15 DIAGNOSIS — Z1231 Encounter for screening mammogram for malignant neoplasm of breast: Secondary | ICD-10-CM | POA: Diagnosis not present

## 2019-09-02 ENCOUNTER — Telehealth: Payer: Medicare HMO

## 2019-09-03 ENCOUNTER — Encounter: Payer: Self-pay | Admitting: Family Medicine

## 2019-09-04 ENCOUNTER — Encounter: Payer: Self-pay | Admitting: Family Medicine

## 2019-09-04 ENCOUNTER — Other Ambulatory Visit: Payer: Self-pay

## 2019-09-04 ENCOUNTER — Ambulatory Visit (INDEPENDENT_AMBULATORY_CARE_PROVIDER_SITE_OTHER): Payer: Medicare HMO | Admitting: Family Medicine

## 2019-09-04 VITALS — BP 150/82 | HR 78 | Ht 64.0 in | Wt 151.2 lb

## 2019-09-04 DIAGNOSIS — S61012A Laceration without foreign body of left thumb without damage to nail, initial encounter: Secondary | ICD-10-CM

## 2019-09-04 NOTE — Patient Instructions (Signed)
Thank you for coming in today. Ok to let the finger soak to get the rest of the dressing off.  I recommend using a finger condom (finger cot) to keep the dressing from getting dirty or protecting the wound from hand sanitizer. Recheck with me as needed.  Look out for wound infection such as painful and red skin.

## 2019-09-04 NOTE — Progress Notes (Signed)
I, Melanie Wilkinson, LAT, ATC, am serving as scribe for Dr. Clementeen Graham.  Melanie Wilkinson is a 68 y.o. female who presents to [facility] today for L thumb pain since 08/29/19 when she suffered a thumb laceration while dicing onions.  Pt went to the [facility] in [facility] for her care and received liquid adhesive sutures.  She is here today for f/u.  Since her injury, pt notes that her L thumb has been feeling fine.  Pt denies any pain or numbness/tingling in the L thumb.    ROS:  As above  Exam:  BP (!) 150/82 (BP Location: Left Arm, Patient Position: Sitting, Cuff Size: Normal)   Pulse 78   Ht 5\' 4"  (1.626 m)   Wt 151 lb 3.2 oz (68.6 kg)   SpO2 98%   BMI 25.95 kg/m  Wt Readings from Last 5 Encounters:  09/04/19 151 lb 3.2 oz (68.6 kg)  02/07/19 145 lb (65.8 kg)  02/05/19 144 lb (65.3 kg)  06/30/17 151 lb (68.5 kg)  05/04/17 148 lb (67.1 kg)   General: Well Developed, well nourished, and in no acute distress.  Neuro/Psych: Alert and oriented x3, extra-ocular muscles intact, able to move all 4 extremities, sensation grossly intact. Skin: Warm and dry, no rashes noted.  Respiratory: Not using accessory muscles, speaking in full sentences, trachea midline.  Cardiovascular: Pulses palpable, no extremity edema. Abdomen: Does not appear distended. MSK: Left thumb with small eschar adhered to gauze at the tip of the thumb.  Concerning skin is normal-appearing with no erythema and nontender.  Sensation is intact.  The dressing was removed with the exception of the tiny less than 1 cm patch of gauze stuck to the wound.  This was left in place and trimmed to size. The finger was then covered up with a finger condom.     Assessment and Plan: 68 y.o. female with left fingertip laceration thumb.  Clinically doing quite well.  Plan to treat conservatively.  Okay to start soaking the finger to get the dressing to fall off starting on January 1 ( 1 week  from the original date of injury).  Tetanus is up-to-date.  Check back with me as needed.  Return sooner if needed.  Historical information moved to improve visibility of documentation.  Past Medical History:  Diagnosis Date  . Fibrocystic breast   . Hypertension   . Thyroid disease    Past Surgical History:  Procedure Laterality Date  . BREAST BIOPSY     Social History   Tobacco Use  . Smoking status: Former 10-10-1974  . Smokeless tobacco: Never Used  Substance Use Topics  . Alcohol use: Yes    Alcohol/week: 1.0 standard drinks    Types: 1 Glasses of wine per week    Comment: occasional   family history includes Hypertension in her father and mother.  Medications: Current Outpatient Medications  Medication Sig Dispense Refill  . atorvastatin (LIPITOR) 20 MG tablet TAKE 1 TABLET DAILY 90 tablet 0  . cholecalciferol (VITAMIN D3) 25 MCG (1000 UT) tablet Take 1,000 Units by mouth daily.    Games developer glucosamine-chondroitin 500-400 MG tablet Take 1 tablet by mouth 3 (three) times daily.    Marland Kitchen levothyroxine (SYNTHROID) 50 MCG tablet TAKE 1 TABLET DAILY 90 tablet 0  . Magnesium Chloride-Calcium (MAG-SR PLUS CALCIUM PO) Take by mouth.    . Multiple Vitamin (MULTIVITAMIN WITH MINERALS) TABS tablet Take 1 tablet by mouth daily.    Marland Kitchen  Omega-3 Fatty Acids (FISH OIL) 1000 MG CAPS Take by mouth.     No current facility-administered medications for this visit.   No Known Allergies    Discussed warning signs or symptoms. Please see discharge instructions. Patient expresses understanding.  The above documentation has been reviewed and is accurate and complete [name]

## 2020-01-15 ENCOUNTER — Other Ambulatory Visit: Payer: Self-pay | Admitting: Osteopathic Medicine

## 2020-01-15 DIAGNOSIS — E785 Hyperlipidemia, unspecified: Secondary | ICD-10-CM

## 2020-01-15 DIAGNOSIS — E038 Other specified hypothyroidism: Secondary | ICD-10-CM

## 2020-01-23 ENCOUNTER — Other Ambulatory Visit: Payer: Self-pay

## 2020-01-23 ENCOUNTER — Ambulatory Visit (INDEPENDENT_AMBULATORY_CARE_PROVIDER_SITE_OTHER): Payer: Medicare HMO | Admitting: Family Medicine

## 2020-01-23 ENCOUNTER — Encounter: Payer: Self-pay | Admitting: Family Medicine

## 2020-01-23 VITALS — BP 131/77 | HR 72 | Temp 98.0°F | Ht 64.17 in | Wt 148.1 lb

## 2020-01-23 DIAGNOSIS — E038 Other specified hypothyroidism: Secondary | ICD-10-CM | POA: Diagnosis not present

## 2020-01-23 DIAGNOSIS — E785 Hyperlipidemia, unspecified: Secondary | ICD-10-CM | POA: Diagnosis not present

## 2020-01-23 DIAGNOSIS — G250 Essential tremor: Secondary | ICD-10-CM | POA: Diagnosis not present

## 2020-01-23 MED ORDER — LEVOTHYROXINE SODIUM 50 MCG PO TABS: 50.0000 ug | ORAL_TABLET | Freq: Every day | ORAL | 2 refills | Status: DC

## 2020-01-23 MED ORDER — ATORVASTATIN CALCIUM 20 MG PO TABS: 20.0000 mg | ORAL_TABLET | Freq: Every day | ORAL | 2 refills | Status: DC

## 2020-01-23 NOTE — Assessment & Plan Note (Signed)
She is doing well with atorvastatin Refill provided.  Update lipid/LFT's

## 2020-01-23 NOTE — Patient Instructions (Addendum)
Great to meet you! Please have labs completed.  I will plan to see you again in about 6 months or sooner if neede.d

## 2020-01-23 NOTE — Assessment & Plan Note (Signed)
Denies symptoms at this time.  Update TSH.  

## 2020-01-23 NOTE — Progress Notes (Signed)
Melanie Wilkinson - 69 y.o. female MRN [id number]  Date of birth: [birth year]  Subjective Chief Complaint  Patient presents with  . Establish Care    HPI Melanie Wilkinson is a 69 y.o. female with history of hypothyroidism and HLD here today for follow up.   -Hypothyroidism:  Current management with levothyroxine 41mcg daily.  She is doing well with this and denies symptoms related to hypo/hyper-thyroidism.  Weight is stable.   -HLD:  Treated with atorvastatin. She is tolerating this well and denies myalgias  She has also notice a mild tremor in L hand.  More of intention tremor.  Denies resting tremor.  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   ROS:  A comprehensive ROS was completed and negative except as noted per HPI   No Known Allergies  Past Medical History:  Diagnosis Date  . Fibrocystic breast   . Hypertension   . Thyroid disease     Past Surgical History:  Procedure Laterality Date  . BREAST BIOPSY      Social History   Socioeconomic History  . Marital status: Married    Spouse name: [name]  . Number of children: 2  . Years of education: 57  . Highest education level: 12th grade  Occupational History  . Occupation: real Conservation officer, historic buildings    Comment: retired  Tobacco Use  . Smoking status: Former Research scientist (life sciences)  . Smokeless tobacco: Never Used  Substance and Sexual Activity  . Alcohol use: Yes    Alcohol/week: 1.0 standard drinks    Types: 1 Glasses of wine per week    Comment: occasional  . Drug use: No  . Sexual activity: Yes  Other Topics Concern  . Not on file  Social History Narrative   Workout then walks   Crafts   Reading   tv watching   Social Determinants of Health   Financial Resource Strain: Low Risk   . Difficulty of Paying Living Expenses: Not hard at all  Food Insecurity: No Food Insecurity  . Worried About Charity fundraiser in the Last Year: Never true  . Ran Out of Food in the Last Year: Never true  Transportation Needs:  No Transportation Needs  . Lack of Transportation (Medical): No  . Lack of Transportation (Non-Medical): No  Physical Activity: Sufficiently Active  . Days of Exercise per Week: 5 days  . Minutes of Exercise per Session: 60 min  Stress: No Stress Concern Present  . Feeling of Stress : Not at all  Social Connections: Not Isolated  . Frequency of Communication with Friends and Family: More than three times a week  . Frequency of Social Gatherings with Friends and Family: Twice a week  . Attends Religious Services: More than 4 times per year  . Active Member of Clubs or Organizations: Yes  . Attends Archivist Meetings: More than 4 times per year  . Marital Status: Married    Family History  Problem Relation Age of Onset  . Hypertension Mother   . Hypertension Father     Health Maintenance  Topic Date Due  . PNA vac Low Risk Adult (1 of 2 - PCV13) 04/06/2024 (Originally 10/08/2013)  . INFLUENZA VACCINE  04/05/2020  . MAMMOGRAM  08/14/2021  . TETANUS/TDAP  05/05/2027  . COLONOSCOPY  03/31/2029  . DEXA SCAN  Completed  . COVID-19 Vaccine  Completed  . Hepatitis C Screening  Completed     ----------------------------------------------------------------------------------------------------------------------------------------------------------------------------------------------------------------- Physical Exam  BP 131/77 (BP Location: Left Arm, Patient Position: Sitting, Cuff Size: Normal)   Pulse 72   Temp 98 F (36.7 C) (Temporal)   Ht 5' 4.17" (1.63 m)   Wt 148 lb 1.6 oz (67.2 kg)   SpO2 99%   BMI 25.28 kg/m   Physical Exam Constitutional:      Appearance: Normal appearance.  HENT:     Head: Normocephalic and atraumatic.  Eyes:     General: No scleral icterus. Cardiovascular:     Rate and Rhythm: Normal rate and regular rhythm.     Pulses: Normal pulses.     Heart sounds: Normal heart sounds.  Pulmonary:     Effort: Pulmonary effort is normal.     Breath  sounds: Normal breath sounds.  Musculoskeletal:     Cervical back: Neck supple.  Skin:    General: Skin is warm.  Neurological:     General: No focal deficit present.     Mental Status: She is alert.  Psychiatric:        Mood and Affect: Mood normal.        Behavior: Behavior normal.     ------------------------------------------------------------------------------------------------------------------------------------------------------------------------------------------------------------------- Assessment and Plan  HLD (hyperlipidemia) She is doing well with atorvastatin Refill provided.  Update lipid/LFT's  Hypothyroidism Denies symptoms at this time.  Update TSH.   Benign essential tremor Not bothersome to her right now, will monitor.    Meds ordered this encounter  Medications  . DISCONTD: atorvastatin (LIPITOR) 20 MG tablet    Sig: Take 1 tablet (20 mg total) by mouth daily.    Dispense:  90 tablet    Refill:  2  . DISCONTD: levothyroxine (SYNTHROID) 50 MCG tablet    Sig: Take 1 tablet (50 mcg total) by mouth daily.    Dispense:  90 tablet    Refill:  2  . atorvastatin (LIPITOR) 20 MG tablet    Sig: Take 1 tablet (20 mg total) by mouth daily.    Dispense:  90 tablet    Refill:  2  . levothyroxine (SYNTHROID) 50 MCG tablet    Sig: Take 1 tablet (50 mcg total) by mouth daily.    Dispense:  90 tablet    Refill:  2    Return in about 6 months (around 07/25/2020) for hypothyroid/HLD.    This visit occurred during the SARS-CoV-2 public health emergency.  Safety protocols were in place, including screening questions prior to the visit, additional usage of staff PPE, and extensive cleaning of exam room while observing appropriate contact time as indicated for disinfecting solutions.

## 2020-01-23 NOTE — Assessment & Plan Note (Signed)
Not bothersome to her right now, will monitor.

## 2020-01-25 LAB — COMPLETE METABOLIC PANEL WITH GFR
AG Ratio: 1.8 (calc) (ref 1.0–2.5)
ALT: 27 U/L (ref 6–29)
AST: 23 U/L (ref 10–35)
Albumin: 4.4 g/dL (ref 3.6–5.1)
Alkaline phosphatase (APISO): 53 U/L (ref 37–153)
BUN: 12 mg/dL (ref 7–25)
CO2: 27 mmol/L (ref 20–32)
Calcium: 9.8 mg/dL (ref 8.6–10.4)
Chloride: 107 mmol/L (ref 98–110)
Creat: 0.68 mg/dL (ref 0.60–0.93)
GFR, Est African American: 102 mL/min/{1.73_m2} (ref >=60)
GFR, Est Non African American: 88 mL/min/{1.73_m2} (ref >=60)
Globulin: 2.5 g/dL (calc) (ref 1.9–3.7)
Glucose, Bld: 101 mg/dL — ABNORMAL HIGH (ref 65–99)
Potassium: 4.3 mmol/L (ref 3.5–5.3)
Sodium: 140 mmol/L (ref 135–146)
Total Bilirubin: 0.6 mg/dL (ref 0.2–1.2)
Total Protein: 6.9 g/dL (ref 6.1–8.1)

## 2020-01-25 LAB — LIPID PANEL
Cholesterol: 190 mg/dL (ref <200)
HDL: 71 mg/dL (ref >=50)
LDL Cholesterol (Calc): 98 mg/dL (calc)
Non-HDL Cholesterol (Calc): 119 mg/dL (calc) (ref <130)
Total CHOL/HDL Ratio: 2.7 (calc) (ref <5.0)
Triglycerides: 113 mg/dL (ref <150)

## 2020-01-25 LAB — TSH: TSH: 3.49 mIU/L (ref 0.40–4.50)

## 2020-06-29 ENCOUNTER — Encounter: Payer: Self-pay | Admitting: Family Medicine

## 2020-06-29 DIAGNOSIS — Z13820 Encounter for screening for osteoporosis: Secondary | ICD-10-CM

## 2020-06-29 DIAGNOSIS — Z780 Asymptomatic menopausal state: Secondary | ICD-10-CM

## 2020-06-30 NOTE — Telephone Encounter (Signed)
Ok to order 

## 2020-07-01 ENCOUNTER — Other Ambulatory Visit: Payer: Self-pay | Admitting: Family Medicine

## 2020-07-01 DIAGNOSIS — Z1231 Encounter for screening mammogram for malignant neoplasm of breast: Secondary | ICD-10-CM

## 2020-07-15 ENCOUNTER — Other Ambulatory Visit: Payer: Self-pay

## 2020-07-15 ENCOUNTER — Ambulatory Visit (INDEPENDENT_AMBULATORY_CARE_PROVIDER_SITE_OTHER): Payer: Medicare HMO

## 2020-07-15 DIAGNOSIS — Z780 Asymptomatic menopausal state: Secondary | ICD-10-CM

## 2020-07-15 DIAGNOSIS — Z13820 Encounter for screening for osteoporosis: Secondary | ICD-10-CM

## 2020-07-21 ENCOUNTER — Telehealth (INDEPENDENT_AMBULATORY_CARE_PROVIDER_SITE_OTHER): Payer: Medicare HMO | Admitting: Family Medicine

## 2020-07-21 ENCOUNTER — Encounter: Payer: Self-pay | Admitting: Family Medicine

## 2020-07-21 DIAGNOSIS — M810 Age-related osteoporosis without current pathological fracture: Secondary | ICD-10-CM | POA: Diagnosis not present

## 2020-07-21 NOTE — Progress Notes (Signed)
Melanie Wilkinson - 69 y.o. female MRN [id number]  Date of birth: Dec 16, 1950   This visit type was conducted due to national recommendations for restrictions regarding the COVID-19 Pandemic (e.g. social distancing).  This format is felt to be most appropriate for this patient at this time.  All issues noted in this document were discussed and addressed.  No physical exam was performed (except for noted visual exam findings with Video Visits).  I discussed the limitations of evaluation and management by telemedicine and the availability of in person appointments. The patient expressed understanding and agreed to proceed.  I connected with@ on 07/21/20 at  8:10 AM EST by a video enabled telemedicine application and verified that I am speaking with the correct person using two identifiers.  Present at visit: Everrett Coombe, DO Calton Golds   Patient Location: Home [address] [place] [address]   Provider location:   [facility]  Chief Complaint  Patient presents with  . Osteoporosis    HPI  Melanie Wilkinson is a 69 y.o. female who presents via audio/video conferencing for a telehealth visit today.  She is following up today for recent bone density test.  DEXA with T score -2.5 at spine.  T score at hip normal.  She has been doing a program called OsteoStrong that uses resistance and weight bearing exercises to help with bone health.  She is also taking a vitamin d and calcium supplement.     ROS:  A comprehensive ROS was completed and negative except as noted per HPI  Past Medical History:  Diagnosis Date  . Fibrocystic breast   . Hypertension   . Thyroid disease     Past Surgical History:  Procedure Laterality Date  . BREAST BIOPSY      Family History  Problem Relation Age of Onset  . Hypertension Mother   . Hypertension Father     Social History   Socioeconomic History  . Marital status: Married    Spouse name: Merlyn Albert  . Number of children: 2  . Years of education: 97  .  Highest education level: 12th grade  Occupational History  . Occupation: real Insurance account manager    Comment: retired  Tobacco Use  . Smoking status: Former Games developer  . Smokeless tobacco: Never Used  Vaping Use  . Vaping Use: Never used  Substance and Sexual Activity  . Alcohol use: Yes    Alcohol/week: 1.0 standard drink    Types: 1 Glasses of wine per week    Comment: occasional  . Drug use: No  . Sexual activity: Yes  Other Topics Concern  . Not on file  Social History Narrative   Workout then walks   Crafts   Reading   tv watching   Social Determinants of Health   Financial Resource Strain:   . Difficulty of Paying Living Expenses: Not on file  Food Insecurity:   . Worried About Programme researcher, broadcasting/film/video in the Last Year: Not on file  . Ran Out of Food in the Last Year: Not on file  Transportation Needs:   . Lack of Transportation (Medical): Not on file  . Lack of Transportation (Non-Medical): Not on file  Physical Activity:   . Days of Exercise per Week: Not on file  . Minutes of Exercise per Session: Not on file  Stress:   . Feeling of Stress : Not on file  Social Connections:   . Frequency of Communication with Friends and Family: Not on file  .  Frequency of Social Gatherings with Friends and Family: Not on file  . Attends Religious Services: Not on file  . Active Member of Clubs or Organizations: Not on file  . Attends Banker Meetings: Not on file  . Marital Status: Not on file  Intimate Partner Violence:   . Fear of Current or Ex-Partner: Not on file  . Emotionally Abused: Not on file  . Physically Abused: Not on file  . Sexually Abused: Not on file     Current Outpatient Medications:  .  atorvastatin (LIPITOR) 20 MG tablet, Take 1 tablet (20 mg total) by mouth daily., Disp: 90 tablet, Rfl: 2 .  levothyroxine (SYNTHROID) 50 MCG tablet, Take 1 tablet (50 mcg total) by mouth daily., Disp: 90 tablet, Rfl: 2  EXAM:  VITALS per patient if applicable: Wt  148 lb 1.6 oz (67.2 kg)   BMI 25.28 kg/m   GENERAL: alert, oriented, appears well and in no acute distress  MS: moves all visible extremities without noticeable abnormality  PSYCH/NEURO: pleasant and cooperative, no obvious depression or anxiety, speech and thought processing grossly intact  ASSESSMENT AND PLAN:  Discussed the following assessment and plan:  Osteoporosis Recent DEXA with t-score -2.5 at spine.  She is taking calcium and vitamin d supplement in recommended amounts.  She would like to continue with osteostrong program over the next 1-2 years and see if this improves her t-score prior to initiating medications.       I discussed the assessment and treatment plan with the patient. The patient was provided an opportunity to ask questions and all were answered. The patient agreed with the plan and demonstrated an understanding of the instructions.   The patient was advised to call back or seek an in-person evaluation if the symptoms worsen or if the condition fails to improve as anticipated.    Everrett Coombe, DO

## 2020-07-21 NOTE — Assessment & Plan Note (Signed)
Recent DEXA with t-score -2.5 at spine.  She is taking calcium and vitamin d supplement in recommended amounts.  She would like to continue with osteostrong program over the next 1-2 years and see if this improves her t-score prior to initiating medications.

## 2020-08-19 LAB — NOVEL CORONAVIRUS, NAA: SARS-CoV-2, NAA: DETECTED

## 2020-08-20 ENCOUNTER — Ambulatory Visit: Payer: Medicare HMO

## 2020-08-21 ENCOUNTER — Encounter: Payer: Self-pay | Admitting: Family Medicine

## 2020-08-23 ENCOUNTER — Encounter (HOSPITAL_COMMUNITY): Payer: Self-pay

## 2020-08-23 ENCOUNTER — Other Ambulatory Visit: Payer: Self-pay | Admitting: Physician Assistant

## 2020-08-23 ENCOUNTER — Telehealth (HOSPITAL_COMMUNITY): Payer: Self-pay

## 2020-08-23 ENCOUNTER — Encounter: Payer: Self-pay | Admitting: Physician Assistant

## 2020-08-23 ENCOUNTER — Telehealth (HOSPITAL_COMMUNITY): Payer: Self-pay | Admitting: Family

## 2020-08-23 DIAGNOSIS — I253 Aneurysm of heart: Secondary | ICD-10-CM

## 2020-08-23 DIAGNOSIS — U071 COVID-19: Secondary | ICD-10-CM

## 2020-08-23 DIAGNOSIS — R54 Age-related physical debility: Secondary | ICD-10-CM

## 2020-08-23 NOTE — Progress Notes (Signed)
I connected by phone with Calton Golds on 08/23/2020 at 7:32 PM to discuss the potential use of a new treatment for mild to moderate COVID-19 viral infection in non-hospitalized patients.  This patient is a 69 y.o. female that meets the FDA criteria for Emergency Use Authorization of COVID monoclonal antibody sotrovimab, casirivimab/imdevimab or bamlamivimab/estevimab.  Has a (+) direct SARS-CoV-2 viral test result  Has mild or moderate COVID-19   Is NOT hospitalized due to COVID-19  Is within 10 days of symptom onset  Has at least one of the high risk factor(s) for progression to severe COVID-19 and/or hospitalization as defined in EUA.  Specific high risk criteria : Older age (>/= 71 yo)   I have spoken and communicated the following to the patient or parent/caregiver regarding COVID monoclonal antibody treatment:  1. FDA has authorized the emergency use for the treatment of mild to moderate COVID-19 in adults and pediatric patients with positive results of direct SARS-CoV-2 viral testing who are 50 years of age and older weighing at least 40 kg, and who are at high risk for progressing to severe COVID-19 and/or hospitalization.  2. The significant known and potential risks and benefits of COVID monoclonal antibody, and the extent to which such potential risks and benefits are unknown.  3. Information on available alternative treatments and the risks and benefits of those alternatives, including clinical trials.  4. Patients treated with COVID monoclonal antibody should continue to self-isolate and use infection control measures (e.g., wear mask, isolate, social distance, avoid sharing personal items, clean and disinfect "high touch" surfaces, and frequent handwashing) according to CDC guidelines.   5. The patient or parent/caregiver has the option to accept or refuse COVID monoclonal antibody treatment.  After reviewing this information with the patient, the patient has agreed to receive  one of the available covid 19 monoclonal antibodies and will be provided an appropriate fact sheet prior to infusion.  Sx onset 12/18. Set up for infusion on 12/21 @ 12:30pm. Directions given to [facility]. Pt is aware that insurance will be charged an infusion fee. Pt is fully vaccinated. Tested at [facility].   Cline Crock 08/23/2020 7:32 PM

## 2020-08-23 NOTE — Telephone Encounter (Signed)
Called to discuss with Calton Golds about Covid symptoms and potential candidacy for the use of sotrovimab, a combination monoclonal antibody infusion for those with mild to moderate Covid symptoms and at a high risk of hospitalization.     Pt is qualified for this infusion at the infusion center due to co-morbid conditions and/or a member of an at-risk group, however unable to reach patient. VM left.   Graceson Nichelson,NP

## 2020-08-23 NOTE — Telephone Encounter (Signed)
Called to Discuss with patient about Covid symptoms and the use of the monoclonal antibody infusion for those with mild to moderate Covid symptoms and at a high risk of hospitalization.   °  °Pt appears to qualify for this infusion due to co-morbid conditions and/or a member of an at-risk group in accordance with the FDA Emergency Use Authorization.  °  °Unable to reach pt, VM left ° °

## 2020-08-25 ENCOUNTER — Ambulatory Visit (HOSPITAL_COMMUNITY)
Admission: RE | Admit: 2020-08-25 | Discharge: 2020-08-25 | Disposition: A | Discharge status: Home / Self Care | Payer: Medicare Other | Source: Ambulatory Visit | Attending: Pulmonary Disease | Admitting: Pulmonary Disease

## 2020-08-25 DIAGNOSIS — I253 Aneurysm of heart: Secondary | ICD-10-CM | POA: Diagnosis present

## 2020-08-25 DIAGNOSIS — R54 Age-related physical debility: Secondary | ICD-10-CM

## 2020-08-25 DIAGNOSIS — Z23 Encounter for immunization: Secondary | ICD-10-CM | POA: Insufficient documentation

## 2020-08-25 DIAGNOSIS — U071 COVID-19: Secondary | ICD-10-CM | POA: Insufficient documentation

## 2020-08-25 MED ORDER — SODIUM CHLORIDE 0.9 % IV SOLN: Freq: Once | INTRAVENOUS | Status: AC

## 2020-08-25 MED ORDER — METHYLPREDNISOLONE SODIUM SUCC 125 MG IJ SOLR: 125.0000 mg | Freq: Once | INTRAMUSCULAR | Status: DC | PRN

## 2020-08-25 MED ORDER — ALBUTEROL SULFATE HFA 108 (90 BASE) MCG/ACT IN AERS: 2.0000 | INHALATION_SPRAY | Freq: Once | RESPIRATORY_TRACT | Status: DC | PRN

## 2020-08-25 MED ORDER — SODIUM CHLORIDE 0.9 % IV SOLN: INTRAVENOUS | Status: DC | PRN

## 2020-08-25 MED ORDER — DIPHENHYDRAMINE HCL 50 MG/ML IJ SOLN: 50.0000 mg | Freq: Once | INTRAMUSCULAR | Status: DC | PRN

## 2020-08-25 MED ORDER — EPINEPHRINE 0.3 MG/0.3ML IJ SOAJ: 0.3000 mg | Freq: Once | INTRAMUSCULAR | Status: DC | PRN

## 2020-08-25 MED ORDER — FAMOTIDINE IN NACL 20-0.9 MG/50ML-% IV SOLN: 20.0000 mg | Freq: Once | INTRAVENOUS | Status: DC | PRN

## 2020-08-25 NOTE — Progress Notes (Signed)
Patient reviewed Fact Sheet for Patients, Parents, and Caregivers for Emergency Use Authorization (EUA) of bamlanivimab and etesevimab for the Treatment of Coronavirus. Patient also reviewed and is agreeable to the estimated cost of treatment. Patient is agreeable to proceed.   

## 2020-08-25 NOTE — Progress Notes (Signed)
  Diagnosis: COVID-19  Physician: Patrick Wright  Procedure: [facility] Infusion [facility] Med: bamlanivimab\etesevimab infusion - Provided patient with bamlanimivab\etesevimab fact sheet for patients, parents and caregivers prior to infusion.  Complications: No immediate complications noted.  Discharge: Discharged home   Melanie Wilkinson L 08/25/2020   

## 2020-08-25 NOTE — Discharge Instructions (Signed)
10 Things You Can Do to Manage Your COVID-19 Symptoms at Home If you have possible or confirmed COVID-19: 1. Stay home from work and school. And stay away from other public places. If you must go out, avoid using any kind of public transportation, ridesharing, or taxis. 2. Monitor your symptoms carefully. If your symptoms get worse, call your healthcare provider immediately. 3. Get rest and stay hydrated. 4. If you have a medical appointment, call the healthcare provider ahead of time and tell them that you have or may have COVID-19. 5. For medical emergencies, call 911 and notify the dispatch personnel that you have or may have COVID-19. 6. Cover your cough and sneezes with a tissue or use the inside of your elbow. 7. Wash your hands often with soap and water for at least 20 seconds or clean your hands with an alcohol-based hand sanitizer that contains at least 60% alcohol. 8. As much as possible, stay in a specific room and away from other people in your home. Also, you should use a separate bathroom, if available. If you need to be around other people in or outside of the home, wear a mask. 9. Avoid sharing personal items with other people in your household, like dishes, towels, and bedding. 10. Clean all surfaces that are touched often, like counters, tabletops, and doorknobs. Use household cleaning sprays or wipes according to the label instructions. cdc.gov/coronavirus 03/06/2019 This information is not intended to replace advice given to you by your health care provider. Make sure you discuss any questions you have with your health care provider. Document Revised: 08/08/2019 Document Reviewed: 08/08/2019 Elsevier Patient Education  2020 Elsevier Inc. What types of side effects do monoclonal antibody drugs cause?  Common side effects  In general, the more common side effects caused by monoclonal antibody drugs include: . Allergic reactions, such as hives or itching . Flu-like signs and  symptoms, including chills, fatigue, fever, and muscle aches and pains . Nausea, vomiting . Diarrhea . Skin rashes . Low blood pressure   The CDC is recommending patients who receive monoclonal antibody treatments wait at least 90 days before being vaccinated.  Currently, there are no data on the safety and efficacy of mRNA COVID-19 vaccines in persons who received monoclonal antibodies or convalescent plasma as part of COVID-19 treatment. Based on the estimated half-life of such therapies as well as evidence suggesting that reinfection is uncommon in the 90 days after initial infection, vaccination should be deferred for at least 90 days, as a precautionary measure until additional information becomes available, to avoid interference of the antibody treatment with vaccine-induced immune responses. If you have any questions or concerns after the infusion please call the Advanced Practice Provider on call at [phone]. This number is ONLY intended for your use regarding questions or concerns about the infusion post-treatment side-effects.  Please do not provide this number to others for use. For return to work notes please contact your primary care provider.   If someone you know is interested in receiving treatment please have them call the COVID hotline at [phone].   

## 2020-08-27 ENCOUNTER — Encounter: Payer: Self-pay | Admitting: Family Medicine

## 2020-10-08 ENCOUNTER — Ambulatory Visit (INDEPENDENT_AMBULATORY_CARE_PROVIDER_SITE_OTHER): Payer: Medicare HMO

## 2020-10-08 ENCOUNTER — Other Ambulatory Visit: Payer: Self-pay

## 2020-10-08 DIAGNOSIS — Z1231 Encounter for screening mammogram for malignant neoplasm of breast: Secondary | ICD-10-CM

## 2020-12-28 ENCOUNTER — Other Ambulatory Visit: Payer: Self-pay

## 2020-12-28 DIAGNOSIS — E785 Hyperlipidemia, unspecified: Secondary | ICD-10-CM

## 2020-12-28 DIAGNOSIS — E038 Other specified hypothyroidism: Secondary | ICD-10-CM

## 2020-12-28 DIAGNOSIS — Z0000 Encounter for general adult medical examination without abnormal findings: Secondary | ICD-10-CM

## 2020-12-31 LAB — COMPLETE METABOLIC PANEL WITH GFR
AG Ratio: 1.7 (calc) (ref 1.0–2.5)
ALT: 38 U/L — ABNORMAL HIGH (ref 6–29)
AST: 24 U/L (ref 10–35)
Albumin: 4.5 g/dL (ref 3.6–5.1)
Alkaline phosphatase (APISO): 62 U/L (ref 37–153)
BUN: 12 mg/dL (ref 7–25)
CO2: 27 mmol/L (ref 20–32)
Calcium: 10 mg/dL (ref 8.6–10.4)
Chloride: 105 mmol/L (ref 98–110)
Creat: 0.79 mg/dL (ref 0.60–0.93)
GFR, Est African American: 87 mL/min/{1.73_m2} (ref >=60)
GFR, Est Non African American: 75 mL/min/{1.73_m2} (ref >=60)
Globulin: 2.7 g/dL (calc) (ref 1.9–3.7)
Glucose, Bld: 85 mg/dL (ref 65–99)
Potassium: 4.4 mmol/L (ref 3.5–5.3)
Sodium: 140 mmol/L (ref 135–146)
Total Bilirubin: 0.7 mg/dL (ref 0.2–1.2)
Total Protein: 7.2 g/dL (ref 6.1–8.1)

## 2020-12-31 LAB — LIPID PANEL W/REFLEX DIRECT LDL
Cholesterol: 200 mg/dL — ABNORMAL HIGH (ref <200)
HDL: 64 mg/dL (ref >=50)
LDL Cholesterol (Calc): 110 mg/dL (calc) — ABNORMAL HIGH
Non-HDL Cholesterol (Calc): 136 mg/dL (calc) — ABNORMAL HIGH (ref <130)
Total CHOL/HDL Ratio: 3.1 (calc) (ref <5.0)
Triglycerides: 138 mg/dL (ref <150)

## 2020-12-31 LAB — TSH: TSH: 4.88 mIU/L — ABNORMAL HIGH (ref 0.40–4.50)

## 2021-01-19 ENCOUNTER — Ambulatory Visit (INDEPENDENT_AMBULATORY_CARE_PROVIDER_SITE_OTHER): Payer: Medicare HMO | Admitting: Family Medicine

## 2021-01-19 ENCOUNTER — Other Ambulatory Visit: Payer: Self-pay

## 2021-01-19 ENCOUNTER — Encounter: Payer: Self-pay | Admitting: Family Medicine

## 2021-01-19 VITALS — BP 132/69 | HR 72 | Temp 97.4°F | Ht 64.47 in | Wt 147.0 lb

## 2021-01-19 DIAGNOSIS — E785 Hyperlipidemia, unspecified: Secondary | ICD-10-CM

## 2021-01-19 DIAGNOSIS — E038 Other specified hypothyroidism: Secondary | ICD-10-CM

## 2021-01-19 DIAGNOSIS — M810 Age-related osteoporosis without current pathological fracture: Secondary | ICD-10-CM

## 2021-01-19 DIAGNOSIS — Z0000 Encounter for general adult medical examination without abnormal findings: Secondary | ICD-10-CM

## 2021-01-19 MED ORDER — ATORVASTATIN CALCIUM 40 MG PO TABS: 40.0000 mg | ORAL_TABLET | Freq: Every day | ORAL | 1 refills | Status: DC

## 2021-01-19 MED ORDER — LEVOTHYROXINE SODIUM 75 MCG PO TABS: 75.0000 ug | ORAL_TABLET | Freq: Every day | ORAL | 1 refills | Status: DC

## 2021-01-19 NOTE — Patient Instructions (Signed)
Preventive Care 72 Years and Older, Female Preventive care refers to lifestyle choices and visits with your health care provider that can promote health and wellness. This includes:  A yearly physical exam. This is also called an annual wellness visit.  Regular dental and eye exams.  Immunizations.  Screening for certain conditions.  Healthy lifestyle choices, such as: ? Eating a healthy diet. ? Getting regular exercise. ? Not using drugs or products that contain nicotine and tobacco. ? Limiting alcohol use. What can I expect for my preventive care visit? Physical exam Your health care provider will check your:  Height and weight. These may be used to calculate your BMI (body mass index). BMI is a measurement that tells if you are at a healthy weight.  Heart rate and blood pressure.  Body temperature.  Skin for abnormal spots. Counseling Your health care provider may ask you questions about your:  Past medical problems.  Family's medical history.  Alcohol, tobacco, and drug use.  Emotional well-being.  Home life and relationship well-being.  Sexual activity.  Diet, exercise, and sleep habits.  History of falls.  Memory and ability to understand (cognition).  Work and work Statistician.  Pregnancy and menstrual history.  Access to firearms. What immunizations do I need? Vaccines are usually given at various ages, according to a schedule. Your health care provider will recommend vaccines for you based on your age, medical history, and lifestyle or other factors, such as travel or where you work.   What tests do I need? Blood tests  Lipid and cholesterol levels. These may be checked every 5 years, or more often depending on your overall health.  Hepatitis C test.  Hepatitis B test. Screening  Lung cancer screening. You may have this screening every year starting at age 72 if you have a 30-pack-year history of smoking and currently smoke or have quit within  the past 15 years.  Colorectal cancer screening. ? All adults should have this screening starting at age 72 and continuing until age 72. ? Your health care provider may recommend screening at age 72 if you are at increased risk. ? You will have tests every 1-10 years, depending on your results and the type of screening test.  Diabetes screening. ? This is done by checking your blood sugar (glucose) after you have not eaten for a while (fasting). ? You may have this done every 1-3 years.  Mammogram. ? This may be done every 1-2 years. ? Talk with your health care provider about how often you should have regular mammograms.  Abdominal aortic aneurysm (AAA) screening. You may need this if you are a current or former smoker.  BRCA-related cancer screening. This may be done if you have a family history of breast, ovarian, tubal, or peritoneal cancers. Other tests  STD (sexually transmitted disease) testing, if you are at risk.  Bone density scan. This is done to screen for osteoporosis. You may have this done starting at age 72. Talk with your health care provider about your test results, treatment options, and if necessary, the need for more tests. Follow these instructions at home: Eating and drinking  Eat a diet that includes fresh fruits and vegetables, whole grains, lean protein, and low-fat dairy products. Limit your intake of foods with high amounts of sugar, saturated fats, and salt.  Take vitamin and mineral supplements as recommended by your health care provider.  Do not drink alcohol if your health care provider tells you not to drink.  If you drink alcohol: ? Limit how much you have to 0-1 drink a day. ? Be aware of how much alcohol is in your drink. In the [place] one drink equals one 12 oz bottle of beer (355 mL), one 5 oz glass of wine (148 mL), or one 1 oz glass of hard liquor (44 mL).   Lifestyle  Take daily care of your teeth and gums. Brush your teeth every morning  and night with fluoride toothpaste. Floss one time each day.  Stay active. Exercise for at least 30 minutes 5 or more days each week.  Do not use any products that contain nicotine or tobacco, such as cigarettes, e-cigarettes, and chewing tobacco. If you need help quitting, ask your health care provider.  Do not use drugs.  If you are sexually active, practice safe sex. Use a condom or other form of protection in order to prevent STIs (sexually transmitted infections).  Talk with your health care provider about taking a low-dose aspirin or statin.  Find healthy ways to cope with stress, such as: ? Meditation, yoga, or listening to music. ? Journaling. ? Talking to a trusted person. ? Spending time with friends and family. Safety  Always wear your seat belt while driving or riding in a vehicle.  Do not drive: ? If you have been drinking alcohol. Do not ride with someone who has been drinking. ? When you are tired or distracted. ? While texting.  Wear a helmet and other protective equipment during sports activities.  If you have firearms in your house, make sure you follow all gun safety procedures. What's next?  Visit your health care provider once a year for an annual wellness visit.  Ask your health care provider how often you should have your eyes and teeth checked.  Stay up to date on all vaccines. This information is not intended to replace advice given to you by your health care provider. Make sure you discuss any questions you have with your health care provider. Document Revised: 08/12/2020 Document Reviewed: 08/16/2018 Elsevier Patient Education  2021 Elsevier Inc.  

## 2021-01-19 NOTE — Assessment & Plan Note (Signed)
Well adult Recent labs reviewed-Increasing levothyroxine and atorvastatin.  Recheck labs in 6 weeks.  Immunizations:  Discussed pneumococcal and zoster vaccines.  She declines both.  She is aware of risks and benefits of these vaccines.   Screenings:  UTD Anticipatory guidance:  Recommend restarting calcium along with vitamin d.  Continue weight bearing exercises. Additional recommendations per AVS.

## 2021-01-19 NOTE — Progress Notes (Signed)
Melanie Wilkinson - 70 y.o. female MRN [id number]  Date of birth: [birth year]  Subjective Chief Complaint  Patient presents with  . Annual Exam    HPI Melanie Wilkinson is a 70 y.o. female here today for annual exam.  She had labs completed prior to visit which we reviewed.    Her TSH is mildly elevated.  Reports difficulty with losing weight despite dietary change and regular exercise.    Cholesterol also remains elevated.  She is taking atorvastatin 20mg  daily. She is tolerating this well without side effects.    She has history of osteoporosis.  Has declined pharmacotherapy for this.  She is going to a facility for resistance and weight training.  She is taking vitamin d but stopped taking calcium and is now taking StronBone which the lady at the herbal shop recommended in place of her calcium.   She does not want Pneumonia or shingles vaccines.   She is up to date on mammogram and colon cancer screening.   Review of Systems  Constitutional: Negative for chills, fever, malaise/fatigue and weight loss.  HENT: Negative for congestion, ear pain and sore throat.   Eyes: Negative for blurred vision, double vision and pain.  Respiratory: Negative for cough and shortness of breath.   Cardiovascular: Negative for chest pain and palpitations.  Gastrointestinal: Negative for abdominal pain, blood in stool, constipation, heartburn and nausea.  Genitourinary: Negative for dysuria and urgency.  Musculoskeletal: Negative for joint pain and myalgias.  Neurological: Negative for dizziness and headaches.  Endo/Heme/Allergies: Does not bruise/bleed easily.  Psychiatric/Behavioral: Negative for depression. The patient is not nervous/anxious and does not have insomnia.     No Known Allergies  Past Medical History:  Diagnosis Date  . Fibrocystic breast   . Thyroid disease     Past Surgical History:  Procedure Laterality Date  . BREAST BIOPSY      Social History   Socioeconomic History  . Marital  status: Married    Spouse name:  . Number of children: 2  . Years of education: 30  . Highest education level: 12th grade  Occupational History  . 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 Smoking status: Former Insurance account manager  . Smokeless tobacco: Never Used  Vaping Use  . Vaping Use: Never used  Substance and Sexual Activity  . Alcohol use: Yes    Alcohol/week: 1.0 standard drink    Types: 1 Glasses of wine per week    Comment: occasional  . Drug use: No  . Sexual activity: Yes  Other Topics Concern  . Not on file  Social History Narrative   Workout then walks   Crafts   Reading   tv watching   Social Determinants of Health   Financial Resource Strain: Not on file  Food Insecurity: Not on file  Transportation Needs: Not on file  Physical Activity: Not on file  Stress: Not on file  Social Connections: Not on file    Family History  Problem Relation Age of Onset  . Hypertension Mother   . Hypertension Father     Health Maintenance  Topic Date Due  . PNA vac Low Risk Adult (1 of 2 - PCV13) 04/06/2024 (Originally 10/08/2013)  . INFLUENZA VACCINE  04/05/2021  . MAMMOGRAM  10/08/2022  . TETANUS/TDAP  05/05/2027  . COLONOSCOPY (Pts 45-10yrs Insurance coverage will need to be confirmed)  03/31/2029  . DEXA SCAN  Completed  . COVID-19 Vaccine  Completed  .  Hepatitis C Screening  Completed  . HPV VACCINES  Aged Out     ----------------------------------------------------------------------------------------------------------------------------------------------------------------------------------------------------------------- Physical Exam BP 132/69 (BP Location: Left Arm, Patient Position: Sitting, Cuff Size: Small)   Pulse 72   Temp (!) 97.4 F (36.3 C)   Ht 5' 4.47" (1.638 m)   Wt 147 lb (66.7 kg)   SpO2 97%   BMI 24.87 kg/m   Physical Exam Constitutional:      General: She is not in acute distress. HENT:     Head: Normocephalic  and atraumatic.     Nose: Nose normal.  Eyes:     General: No scleral icterus.    Conjunctiva/sclera: Conjunctivae normal.  Neck:     Thyroid: No thyromegaly.  Cardiovascular:     Rate and Rhythm: Normal rate and regular rhythm.     Heart sounds: Normal heart sounds.  Pulmonary:     Effort: Pulmonary effort is normal.     Breath sounds: Normal breath sounds.  Abdominal:     General: Bowel sounds are normal. There is no distension.     Palpations: Abdomen is soft.     Tenderness: There is no abdominal tenderness. There is no guarding.  Musculoskeletal:        General: Normal range of motion.     Cervical back: Normal range of motion and neck supple.  Lymphadenopathy:     Cervical: No cervical adenopathy.  Skin:    General: Skin is warm and dry.     Findings: No rash.  Neurological:     Mental Status: She is alert and oriented to person, place, and time.     Cranial Nerves: No cranial nerve deficit.     Coordination: Coordination normal.  Psychiatric:        Behavior: Behavior normal.     ------------------------------------------------------------------------------------------------------------------------------------------------------------------------------------------------------------------- Assessment and Plan  Well adult exam Well adult Recent labs reviewed-Increasing levothyroxine and atorvastatin.  Recheck labs in 6 weeks.  Immunizations:  Discussed pneumococcal and zoster vaccines.  She declines both.  She is aware of risks and benefits of these vaccines.   Screenings:  UTD Anticipatory guidance:  Recommend restarting calcium along with vitamin d.  Continue weight bearing exercises. Additional recommendations per AVS.     Meds ordered this encounter  Medications  . levothyroxine (SYNTHROID) 75 MCG tablet    Sig: Take 1 tablet (75 mcg total) by mouth daily.    Dispense:  90 tablet    Refill:  1  . atorvastatin (LIPITOR) 40 MG tablet    Sig: Take 1 tablet  (40 mg total) by mouth daily.    Dispense:  90 tablet    Refill:  1    No follow-ups on file.    This visit occurred during the SARS-CoV-2 public health emergency.  Safety protocols were in place, including screening questions prior to the visit, additional usage of staff PPE, and extensive cleaning of exam room while observing appropriate contact time as indicated for disinfecting solutions.

## 2021-01-27 ENCOUNTER — Other Ambulatory Visit: Payer: Self-pay

## 2021-01-27 DIAGNOSIS — E785 Hyperlipidemia, unspecified: Secondary | ICD-10-CM

## 2021-01-27 DIAGNOSIS — E038 Other specified hypothyroidism: Secondary | ICD-10-CM

## 2021-01-27 MED ORDER — LEVOTHYROXINE SODIUM 75 MCG PO TABS: 75.0000 ug | ORAL_TABLET | Freq: Every day | ORAL | 1 refills | Status: DC

## 2021-01-27 MED ORDER — ATORVASTATIN CALCIUM 40 MG PO TABS: 40.0000 mg | ORAL_TABLET | Freq: Every day | ORAL | 1 refills | Status: DC

## 2021-03-10 ENCOUNTER — Encounter: Payer: Self-pay | Admitting: Family Medicine

## 2021-03-11 LAB — HEPATIC FUNCTION PANEL
AG Ratio: 1.7 (calc) (ref 1.0–2.5)
ALT: 23 U/L (ref 6–29)
AST: 18 U/L (ref 10–35)
Albumin: 4.5 g/dL (ref 3.6–5.1)
Alkaline phosphatase (APISO): 56 U/L (ref 37–153)
Bilirubin, Direct: 0.1 mg/dL (ref 0.0–0.2)
Globulin: 2.7 g/dL (calc) (ref 1.9–3.7)
Indirect Bilirubin: 0.6 mg/dL (calc) (ref 0.2–1.2)
Total Bilirubin: 0.7 mg/dL (ref 0.2–1.2)
Total Protein: 7.2 g/dL (ref 6.1–8.1)

## 2021-03-11 LAB — TSH: TSH: 1.51 mIU/L (ref 0.40–4.50)

## 2021-03-11 LAB — VITAMIN D 25 HYDROXY (VIT D DEFICIENCY, FRACTURES): Vit D, 25-Hydroxy: 43 ng/mL (ref 30–100)

## 2021-03-17 ENCOUNTER — Encounter: Payer: Self-pay | Admitting: Family Medicine

## 2021-08-12 DIAGNOSIS — Z20822 Contact with and (suspected) exposure to covid-19: Secondary | ICD-10-CM | POA: Diagnosis not present

## 2021-08-12 DIAGNOSIS — J0100 Acute maxillary sinusitis, unspecified: Secondary | ICD-10-CM | POA: Diagnosis not present

## 2021-08-12 DIAGNOSIS — B9689 Other specified bacterial agents as the cause of diseases classified elsewhere: Secondary | ICD-10-CM | POA: Diagnosis not present

## 2021-08-12 DIAGNOSIS — R0981 Nasal congestion: Secondary | ICD-10-CM | POA: Diagnosis not present

## 2021-08-25 ENCOUNTER — Telehealth: Payer: Medicare HMO | Admitting: Physician Assistant

## 2021-08-25 DIAGNOSIS — U071 COVID-19: Secondary | ICD-10-CM

## 2021-08-25 MED ORDER — BENZONATATE 100 MG PO CAPS: 100.0000 mg | ORAL_CAPSULE | Freq: Three times a day (TID) | ORAL | 0 refills | Status: DC | PRN

## 2021-08-25 NOTE — Progress Notes (Signed)

## 2021-08-25 NOTE — Progress Notes (Signed)
I have spent 5 minutes in review of e-visit questionnaire, review and updating patient chart, medical decision making and response to patient.   Jak Haggar Cody Monzerrath Mcburney, PA-C    

## 2021-10-03 ENCOUNTER — Other Ambulatory Visit: Payer: Self-pay | Admitting: Family Medicine

## 2021-10-03 DIAGNOSIS — E038 Other specified hypothyroidism: Secondary | ICD-10-CM

## 2021-10-03 DIAGNOSIS — E559 Vitamin D deficiency, unspecified: Secondary | ICD-10-CM

## 2021-10-03 DIAGNOSIS — E785 Hyperlipidemia, unspecified: Secondary | ICD-10-CM

## 2021-10-04 NOTE — Telephone Encounter (Signed)
Please contact patient to schedule 6 month follow-up appt.   Will send 30 day refill after appt scheduled.

## 2021-10-05 NOTE — Telephone Encounter (Signed)
Patient has been scheduled for 6 month follow up on 10/20/2021 and was wondering if she could have lab orders put in prior to her appt.

## 2021-10-07 ENCOUNTER — Encounter: Payer: Self-pay | Admitting: Family Medicine

## 2021-10-19 ENCOUNTER — Other Ambulatory Visit (HOSPITAL_BASED_OUTPATIENT_CLINIC_OR_DEPARTMENT_OTHER): Payer: Self-pay | Admitting: Family Medicine

## 2021-10-19 DIAGNOSIS — Z1231 Encounter for screening mammogram for malignant neoplasm of breast: Secondary | ICD-10-CM

## 2021-10-20 ENCOUNTER — Encounter: Payer: Self-pay | Admitting: Family Medicine

## 2021-10-20 ENCOUNTER — Ambulatory Visit (INDEPENDENT_AMBULATORY_CARE_PROVIDER_SITE_OTHER): Payer: Medicare HMO | Admitting: Family Medicine

## 2021-10-20 ENCOUNTER — Other Ambulatory Visit: Payer: Self-pay

## 2021-10-20 VITALS — BP 135/87 | HR 78 | Ht 64.0 in | Wt 146.0 lb

## 2021-10-20 DIAGNOSIS — E785 Hyperlipidemia, unspecified: Secondary | ICD-10-CM

## 2021-10-20 DIAGNOSIS — E038 Other specified hypothyroidism: Secondary | ICD-10-CM

## 2021-10-20 DIAGNOSIS — M810 Age-related osteoporosis without current pathological fracture: Secondary | ICD-10-CM | POA: Diagnosis not present

## 2021-10-20 DIAGNOSIS — E559 Vitamin D deficiency, unspecified: Secondary | ICD-10-CM | POA: Diagnosis not present

## 2021-10-20 NOTE — Progress Notes (Signed)
Melanie Wilkinson - 71 y.o. female MRN [id number]  Date of birth: [birth year]  Subjective Chief Complaint  Patient presents with   Follow-up    HPI Melanie Wilkinson is a 71 year old female here today for follow-up visit.  Reports overall she is doing well.  Feels good at current dose of levothyroxine.  She is tolerating atorvastatin well.  She does have history of osteoporosis and has been doing vitamin D and stron bone supplementation as well as weightbearing exercises.  She will go ahead and get her her updated DEXA scan to see if this regimen is working.   ROS:  A comprehensive ROS was completed and negative except as noted per HPI  No Known Allergies  Past Medical History:  Diagnosis Date   Fibrocystic breast    Thyroid disease     Past Surgical History:  Procedure Laterality Date   BREAST BIOPSY      Social History   Socioeconomic History   Marital status: Married    Spouse name: Melanie Wilkinson   Number of children: 2   Years of education: 12   Highest education level: 12th grade  Occupational History   Occupation: Customer service manager    Comment: retired  Tobacco Use   Smoking status: Former   Smokeless tobacco: Never  Building services engineer Use: Never used  Substance and Sexual Activity   Alcohol use: Yes    Alcohol/week: 1.0 standard drink    Types: 1 Glasses of wine per week    Comment: occasional   Drug use: No   Sexual activity: Yes  Other Topics Concern   Not on file  Social History Narrative   Workout then walks   Crafts   Reading   tv watching   Social Determinants of Health   Financial Resource Strain: Not on file  Food Insecurity: Not on file  Transportation Needs: Not on file  Physical Activity: Not on file  Stress: Not on file  Social Connections: Not on file    Family History  Problem Relation Age of Onset   Hypertension Mother    Hypertension Father     Health Maintenance  Topic Date Due   Pneumonia Vaccine 2+ Years old (1 - PCV) 10/20/2022 (Originally  10/08/2013)   Zoster Vaccines- Shingrix (1 of 2) 01/18/2023 (Originally 10/08/1998)   MAMMOGRAM  10/08/2022   TETANUS/TDAP  05/05/2027   COLONOSCOPY (Pts 45-49yrs Insurance coverage will need to be confirmed)  03/31/2029   INFLUENZA VACCINE  Completed   DEXA SCAN  Completed   COVID-19 Vaccine  Completed   Hepatitis C Screening  Completed   HPV VACCINES  Aged Out     ----------------------------------------------------------------------------------------------------------------------------------------------------------------------------------------------------------------- Physical Exam BP 135/87 (BP Location: Left Arm, Patient Position: Sitting, Cuff Size: Normal)    Pulse 78    Ht 5\' 4"  (1.626 m)    Wt 146 lb (66.2 kg)    SpO2 95%    BMI 25.06 kg/m   Physical Exam Constitutional:      Appearance: Normal appearance.  Eyes:     General: No scleral icterus. Cardiovascular:     Rate and Rhythm: Normal rate and regular rhythm.  Pulmonary:     Effort: Pulmonary effort is normal.     Breath sounds: Normal breath sounds.  Musculoskeletal:     Cervical back: Neck supple.  Neurological:     Mental Status: She is alert.  Psychiatric:        Mood and Affect: Mood normal.  Behavior: Behavior normal.    ------------------------------------------------------------------------------------------------------------------------------------------------------------------------------------------------------------------- Assessment and Plan  Hypothyroidism She continues to do well with current dose of levothyroxine.  Recommend continuation  HLD (hyperlipidemia) She continues to do well with atorvastatin.  Plan to recheck lipid panel at next visit.  Continue atorvastatin at current strength.  Osteoporosis Updated DEXA ordered.   No orders of the defined types were placed in this encounter.   Return in about 6 months (around 04/19/2022) for Thyroid.    This visit occurred during the  SARS-CoV-2 public health emergency.  Safety protocols were in place, including screening questions prior to the visit, additional usage of staff PPE, and extensive cleaning of exam room while observing appropriate contact time as indicated for disinfecting solutions.

## 2021-10-20 NOTE — Assessment & Plan Note (Signed)
She continues to do well with atorvastatin.  Plan to recheck lipid panel at next visit.  Continue atorvastatin at current strength.

## 2021-10-20 NOTE — Assessment & Plan Note (Signed)
She continues to do well with current dose of levothyroxine.  Recommend continuation

## 2021-10-20 NOTE — Assessment & Plan Note (Signed)
Updated DEXA ordered. °

## 2021-10-21 ENCOUNTER — Encounter: Payer: Self-pay | Admitting: Family Medicine

## 2021-10-21 ENCOUNTER — Ambulatory Visit (INDEPENDENT_AMBULATORY_CARE_PROVIDER_SITE_OTHER): Payer: Medicare HMO

## 2021-10-21 DIAGNOSIS — Z1231 Encounter for screening mammogram for malignant neoplasm of breast: Secondary | ICD-10-CM

## 2021-10-21 LAB — COMPLETE METABOLIC PANEL WITH GFR
AG Ratio: 1.7 (calc) (ref 1.0–2.5)
ALT: 32 U/L — ABNORMAL HIGH (ref 6–29)
AST: 24 U/L (ref 10–35)
Albumin: 4.5 g/dL (ref 3.6–5.1)
Alkaline phosphatase (APISO): 62 U/L (ref 37–153)
BUN: 15 mg/dL (ref 7–25)
CO2: 27 mmol/L (ref 20–32)
Calcium: 9.9 mg/dL (ref 8.6–10.4)
Chloride: 105 mmol/L (ref 98–110)
Creat: 0.69 mg/dL (ref 0.60–1.00)
Globulin: 2.6 g/dL (calc) (ref 1.9–3.7)
Glucose, Bld: 97 mg/dL (ref 65–99)
Potassium: 4.6 mmol/L (ref 3.5–5.3)
Sodium: 139 mmol/L (ref 135–146)
Total Bilirubin: 0.6 mg/dL (ref 0.2–1.2)
Total Protein: 7.1 g/dL (ref 6.1–8.1)
eGFR: 92 mL/min/{1.73_m2} (ref >=60)

## 2021-10-21 LAB — CBC WITH DIFFERENTIAL/PLATELET
Absolute Monocytes: 368 cells/uL (ref 200–950)
Basophils Absolute: 40 cells/uL (ref 0–200)
Basophils Relative: 1 %
Eosinophils Absolute: 68 cells/uL (ref 15–500)
Eosinophils Relative: 1.7 %
HCT: 44.2 % (ref 35.0–45.0)
Hemoglobin: 14.5 g/dL (ref 11.7–15.5)
Lymphs Abs: 1488 cells/uL (ref 850–3900)
MCH: 30.1 pg (ref 27.0–33.0)
MCHC: 32.8 g/dL (ref 32.0–36.0)
MCV: 91.9 fL (ref 80.0–100.0)
MPV: 11.4 fL (ref 7.5–12.5)
Monocytes Relative: 9.2 %
Neutro Abs: 2036 cells/uL (ref 1500–7800)
Neutrophils Relative %: 50.9 %
Platelets: 367 10*3/uL (ref 140–400)
RBC: 4.81 10*6/uL (ref 3.80–5.10)
RDW: 12.4 % (ref 11.0–15.0)
Total Lymphocyte: 37.2 %
WBC: 4 10*3/uL (ref 3.8–10.8)

## 2021-10-21 LAB — TSH: TSH: 0.97 mIU/L (ref 0.40–4.50)

## 2021-10-21 LAB — LIPID PANEL W/REFLEX DIRECT LDL
Cholesterol: 188 mg/dL (ref <200)
HDL: 70 mg/dL (ref >=50)
LDL Cholesterol (Calc): 88 mg/dL (calc)
Non-HDL Cholesterol (Calc): 118 mg/dL (calc) (ref <130)
Total CHOL/HDL Ratio: 2.7 (calc) (ref <5.0)
Triglycerides: 200 mg/dL — ABNORMAL HIGH (ref <150)

## 2021-10-21 LAB — VITAMIN D 25 HYDROXY (VIT D DEFICIENCY, FRACTURES): Vit D, 25-Hydroxy: 48 ng/mL (ref 30–100)

## 2021-10-22 ENCOUNTER — Encounter: Payer: Self-pay | Admitting: Family Medicine

## 2021-12-17 DIAGNOSIS — Z0100 Encounter for examination of eyes and vision without abnormal findings: Secondary | ICD-10-CM | POA: Diagnosis not present

## 2021-12-17 DIAGNOSIS — H524 Presbyopia: Secondary | ICD-10-CM | POA: Diagnosis not present

## 2022-03-01 DIAGNOSIS — L821 Other seborrheic keratosis: Secondary | ICD-10-CM | POA: Diagnosis not present

## 2022-03-01 DIAGNOSIS — D229 Melanocytic nevi, unspecified: Secondary | ICD-10-CM | POA: Diagnosis not present

## 2022-03-01 DIAGNOSIS — D489 Neoplasm of uncertain behavior, unspecified: Secondary | ICD-10-CM | POA: Diagnosis not present

## 2022-03-01 DIAGNOSIS — D0361 Melanoma in situ of right upper limb, including shoulder: Secondary | ICD-10-CM | POA: Diagnosis not present

## 2022-03-24 DIAGNOSIS — D0361 Melanoma in situ of right upper limb, including shoulder: Secondary | ICD-10-CM | POA: Diagnosis not present

## 2022-04-01 ENCOUNTER — Other Ambulatory Visit: Payer: Self-pay | Admitting: Family Medicine

## 2022-04-01 DIAGNOSIS — E038 Other specified hypothyroidism: Secondary | ICD-10-CM

## 2022-04-01 DIAGNOSIS — E785 Hyperlipidemia, unspecified: Secondary | ICD-10-CM

## 2022-04-27 ENCOUNTER — Encounter: Payer: Self-pay | Admitting: General Practice

## 2022-08-04 ENCOUNTER — Ambulatory Visit
Admission: EM | Admit: 2022-08-04 | Discharge: 2022-08-04 | Disposition: A | Discharge status: Home / Self Care | Payer: Medicare HMO | Attending: Family Medicine | Admitting: Family Medicine

## 2022-08-04 ENCOUNTER — Ambulatory Visit (INDEPENDENT_AMBULATORY_CARE_PROVIDER_SITE_OTHER): Payer: Medicare HMO

## 2022-08-04 DIAGNOSIS — S8001XA Contusion of right knee, initial encounter: Secondary | ICD-10-CM

## 2022-08-04 DIAGNOSIS — S82044A Nondisplaced comminuted fracture of right patella, initial encounter for closed fracture: Secondary | ICD-10-CM | POA: Diagnosis not present

## 2022-08-04 DIAGNOSIS — M25561 Pain in right knee: Secondary | ICD-10-CM | POA: Diagnosis not present

## 2022-08-04 NOTE — ED Triage Notes (Signed)
Pt presents with c/o rt knee pain after a fall that occurred yesterday.

## 2022-08-04 NOTE — Discharge Instructions (Signed)
Put ice on area for 20 minutes every couple of hours Limit walking while leg is painful Take ibuprofen 600 mg up to 3 times a day with food Call your doctors office if not improving by Monday.  You may want to see Dr. Benjamin Stain if pain persists or worsen

## 2022-08-04 NOTE — ED Provider Notes (Signed)
[facility]    CSN: [id number] Arrival date & time: 08/04/22  0920      History   Chief Complaint Chief Complaint  Patient presents with   Knee Injury    HPI Melanie Wilkinson is a 71 y.o. female.   HPI  Patient tripped over a dog toy and landed on her flexed knee yesterday.  She has pain in her right knee.  Swollen and tender.  Pain with weightbearing.  Here with evaluation.  No prior knee problems.  Past Medical History:  Diagnosis Date   Fibrocystic breast    Thyroid disease     Patient Active Problem List   Diagnosis Date Noted   Well adult exam 01/19/2021   Osteoporosis 07/21/2020   Benign essential tremor 01/23/2020   Diverticula of colon 05/18/2017   Atrial septal aneurysm 05/16/2016   Back pain 04/06/2016   HLD (hyperlipidemia) 04/06/2016   Hypothyroidism 04/06/2016    Past Surgical History:  Procedure Laterality Date   BREAST BIOPSY      OB History   No obstetric history on file.      Home Medications    Prior to Admission medications   Medication Sig Start Date End Date Taking? Authorizing Provider  atorvastatin (LIPITOR) 40 MG tablet TAKE 1 TABLET DAILY 04/04/22   Everrett Coombe, DO  levothyroxine (SYNTHROID) 75 MCG tablet TAKE 1 TABLET DAILY 04/04/22   Everrett Coombe, DO    Family History Family History  Problem Relation Age of Onset   Hypertension Mother    Hypertension Father     Social History Social History   Tobacco Use   Smoking status: Former   Smokeless tobacco: Never  Building services engineer Use: Never used  Substance Use Topics   Alcohol use: Yes    Alcohol/week: 1.0 standard drink of alcohol    Types: 1 Glasses of wine per week    Comment: occasional   Drug use: No     Allergies   Patient has no known allergies.   Review of Systems Review of Systems  See HPI Physical Exam Triage Vital Signs ED Triage Vitals  Enc Vitals Group     BP 08/04/22 0934 137/85     Pulse Rate 08/04/22 0934 80     Resp  08/04/22 0934 12     Temp 08/04/22 0934 98.4 F (36.9 C)     Temp Source 08/04/22 0934 Oral     SpO2 08/04/22 0934 95 %     Weight --      Height --      Head Circumference --      Peak Flow --      Pain Score 08/04/22 0933 8     Pain Loc --      Pain Edu? --      Excl. in GC? --    No data found.  Updated Vital Signs BP 137/85 (BP Location: Left Arm)   Pulse 80   Temp 98.4 F (36.9 C) (Oral)   Resp 12   SpO2 95%   Visual Acuity Right Eye Distance:   Left Eye Distance:   Bilateral Distance:    Right Eye Near:   Left Eye Near:    Bilateral Near:     Physical Exam Constitutional:      General: She is not in acute distress.    Appearance: She is well-developed.  HENT:     Head: Normocephalic and atraumatic.  Eyes:     Conjunctiva/sclera: Conjunctivae  normal.     Pupils: Pupils are equal, round, and reactive to light.  Cardiovascular:     Rate and Rhythm: Normal rate.  Pulmonary:     Effort: Pulmonary effort is normal. No respiratory distress.  Abdominal:     General: There is no distension.     Palpations: Abdomen is soft.  Musculoskeletal:        General: Swelling, tenderness and signs of injury present. No deformity. Normal range of motion.     Cervical back: Normal range of motion.     Comments: There is bruising and tenderness present over the lower pole of the right patella, mild soft tissue swelling in the anterior tissues.  Pain with flexion and extension.  Can flex to 90 degrees and extend fully.  No instability or joint line tenderness  Skin:    General: Skin is warm and dry.  Neurological:     Mental Status: She is alert.     Gait: Gait abnormal.      UC Treatments / Results  Labs (all labs ordered are listed, but only abnormal results are displayed) Labs Reviewed - No data to display  EKG   Radiology DG Knee Complete 4 Views Right  Result Date: 08/04/2022 CLINICAL DATA:  Fall EXAM: RIGHT KNEE - COMPLETE 4 VIEW COMPARISON:  None  Available. FINDINGS: Lucencies of the patella with both vertical and horizontal components, concerning for nondisplaced fracture. Small joint effusion. No evidence of arthropathy or other focal bone abnormality. Soft tissue swelling of the anterior knee. IMPRESSION: Nondisplaced mildly comminuted patellar fracture Electronically Signed   By: Allegra Lai M.D.   On: 08/04/2022 10:14    Procedures Procedures (including critical care time)  Medications Ordered in UC Medications - No data to display  Initial Impression / Assessment and Plan / UC Course  I have reviewed the triage vital signs and the nursing notes.  Pertinent labs & imaging results that were available during my care of the patient were reviewed by me and considered in my medical decision making (see chart for details).     Final Clinical Impressions(s) / UC Diagnoses   Final diagnoses:  Acute pain of right knee  Contusion of right knee, initial encounter     Discharge Instructions      Put ice on area for 20 minutes every couple of hours Limit walking while leg is painful Take ibuprofen 600 mg up to 3 times a day with food Call your doctors office if not improving by Monday.  You may want to see Dr. Benjamin Stain if pain persists or worsen   ED Prescriptions   None    PDMP not reviewed this encounter.   Eustace Moore, MD 08/04/22 [phone]

## 2022-08-04 NOTE — ED Notes (Signed)
Pt declined tylenol during triage process.

## 2022-08-12 ENCOUNTER — Ambulatory Visit (INDEPENDENT_AMBULATORY_CARE_PROVIDER_SITE_OTHER): Payer: Medicare HMO | Admitting: Family Medicine

## 2022-08-12 ENCOUNTER — Encounter: Payer: Self-pay | Admitting: Family Medicine

## 2022-08-12 VITALS — Ht 64.0 in | Wt 146.0 lb

## 2022-08-12 DIAGNOSIS — S82046A Nondisplaced comminuted fracture of unspecified patella, initial encounter for closed fracture: Secondary | ICD-10-CM | POA: Diagnosis not present

## 2022-08-12 DIAGNOSIS — S82091A Other fracture of right patella, initial encounter for closed fracture: Secondary | ICD-10-CM | POA: Diagnosis not present

## 2022-08-12 NOTE — Progress Notes (Unsigned)
Pt presented for a knee immobilizer.

## 2022-08-12 NOTE — Telephone Encounter (Signed)
Called patient. She is scheduled for today on the nurse schedule and with Dr Benjamin Stain.

## 2022-08-12 NOTE — Telephone Encounter (Signed)
Xrays from urgent care reviewed.  She should be placed in knee immobilizer in full extension.  Have her stop by here to fit for this.  Please dispense crutches.  Limit weight bearing and schedule with Dr. Benjamin Stain or Dr. Jordan Likes in [facility].

## 2022-08-14 NOTE — Progress Notes (Signed)
Medical screening examination/treatment was performed by qualified clinical staff member and as supervising physician I was immediately available for consultation/collaboration. I have reviewed documentation and agree with assessment and plan.  Jaielle Dlouhy, DO  

## 2022-08-15 ENCOUNTER — Encounter: Payer: Self-pay | Admitting: Family Medicine

## 2022-08-15 NOTE — Telephone Encounter (Signed)
Yes, she can come in to pick up crutches.

## 2022-08-23 ENCOUNTER — Ambulatory Visit (INDEPENDENT_AMBULATORY_CARE_PROVIDER_SITE_OTHER): Payer: Medicare HMO

## 2022-08-23 ENCOUNTER — Ambulatory Visit (INDEPENDENT_AMBULATORY_CARE_PROVIDER_SITE_OTHER): Payer: Medicare HMO | Admitting: Sports Medicine

## 2022-08-23 DIAGNOSIS — S82091A Other fracture of right patella, initial encounter for closed fracture: Secondary | ICD-10-CM | POA: Diagnosis not present

## 2022-08-23 DIAGNOSIS — S82001A Unspecified fracture of right patella, initial encounter for closed fracture: Secondary | ICD-10-CM | POA: Diagnosis not present

## 2022-08-23 DIAGNOSIS — M1711 Unilateral primary osteoarthritis, right knee: Secondary | ICD-10-CM | POA: Diagnosis not present

## 2022-08-23 DIAGNOSIS — M25461 Effusion, right knee: Secondary | ICD-10-CM | POA: Diagnosis not present

## 2022-08-23 NOTE — Progress Notes (Signed)
    Procedures performed today:    None.  Independent interpretation of notes and tests performed by another provider:   I personally reviewed her x-rays, she has a comminuted patella fracture, nondisplaced  Brief History, Exam, Impression, and Recommendations:    Fracture of patella, right, closed Pleasant 71 year old female, approximately 2 and half weeks ago she tripped over a dog toy, and fell directly over her right kneecap. Had immediate pain, swelling, bruising. Was seen in urgent care where x-rays showed a fairly comminuted fracture through the patella, nondisplaced. She was appropriately placed in a knee immobilizer and referred to me. On exam she only has minimal to no pain to palpation over the patella, she has good strength to extension indicating continuity of the extensor apparatus. I think we can treat this nonoperatively, continue knee immobilizer for at least another 2 weeks, I would like updated patellar films. Return to see me in approximately 3 weeks and we can transition into a hinged knee brace and start therapy at that point if she is doing well.    ____________________________________________ Ihor Austin. Benjamin Stain, M.D., ABFM., CAQSM., AME. Primary Care and Sports Medicine [facility]  Adjunct Professor of Family Medicine  [employer] [facility] [employer] [facility]  Restaurant manager, fast food

## 2022-08-23 NOTE — Assessment & Plan Note (Signed)
Pleasant 71 year old female, approximately 2 and half weeks ago she tripped over a dog toy, and fell directly over her right kneecap. Had immediate pain, swelling, bruising. Was seen in urgent care where x-rays showed a fairly comminuted fracture through the patella, nondisplaced. She was appropriately placed in a knee immobilizer and referred to me. On exam she only has minimal to no pain to palpation over the patella, she has good strength to extension indicating continuity of the extensor apparatus. I think we can treat this nonoperatively, continue knee immobilizer for at least another 2 weeks, I would like updated patellar films. Return to see me in approximately 3 weeks and we can transition into a hinged knee brace and start therapy at that point if she is doing well.

## 2022-09-07 ENCOUNTER — Other Ambulatory Visit: Payer: Self-pay | Admitting: Family Medicine

## 2022-09-07 DIAGNOSIS — Z1231 Encounter for screening mammogram for malignant neoplasm of breast: Secondary | ICD-10-CM

## 2022-09-07 DIAGNOSIS — M810 Age-related osteoporosis without current pathological fracture: Secondary | ICD-10-CM

## 2022-09-07 DIAGNOSIS — Z780 Asymptomatic menopausal state: Secondary | ICD-10-CM

## 2022-09-13 ENCOUNTER — Ambulatory Visit (INDEPENDENT_AMBULATORY_CARE_PROVIDER_SITE_OTHER): Payer: Medicare HMO

## 2022-09-13 ENCOUNTER — Ambulatory Visit (INDEPENDENT_AMBULATORY_CARE_PROVIDER_SITE_OTHER): Payer: Medicare HMO | Admitting: Sports Medicine

## 2022-09-13 DIAGNOSIS — S82044A Nondisplaced comminuted fracture of right patella, initial encounter for closed fracture: Secondary | ICD-10-CM | POA: Diagnosis not present

## 2022-09-13 DIAGNOSIS — S82091D Other fracture of right patella, subsequent encounter for closed fracture with routine healing: Secondary | ICD-10-CM | POA: Diagnosis not present

## 2022-09-13 NOTE — Assessment & Plan Note (Signed)
Melanie Wilkinson returns, she is a pleasant 72 year old female now she is approximately 7 weeks status post comminuted patella fracture, nondisplaced after tripping over a dog toy. Today we discontinued the knee immobilizer, she has good function of the extensor apparatus, only minimal tenderness to the patella, good motion with flexion to 90 degrees with coaxing. She will discontinue her knee immobilizer, she can do partial weightbearing with crutches, I would like updated x-rays and will start physical therapy. Return to see me in 6-week.

## 2022-09-13 NOTE — Progress Notes (Signed)
    Procedures performed today:    None.  Independent interpretation of notes and tests performed by another provider:   None.  Brief History, Exam, Impression, and Recommendations:    Fracture of patella, right, closed Melanie Wilkinson returns, she is a pleasant 72 year old female now she is approximately 7 weeks status post comminuted patella fracture, nondisplaced after tripping over a dog toy. Today we discontinued the knee immobilizer, she has good function of the extensor apparatus, only minimal tenderness to the patella, good motion with flexion to 90 degrees with coaxing. She will discontinue her knee immobilizer, she can do partial weightbearing with crutches, I would like updated x-rays and will start physical therapy. Return to see me in 6-week.    ____________________________________________ Gwen Her. Dianah Field, M.D., ABFM., CAQSM., AME. Primary Care and Sports Medicine [facility]  Adjunct Professor of [place] [employer] [facility] of Medicine  Risk manager

## 2022-09-14 ENCOUNTER — Encounter: Payer: Self-pay | Admitting: Rehabilitative and Restorative Service Providers"

## 2022-09-14 ENCOUNTER — Ambulatory Visit: Payer: Medicare HMO | Attending: Sports Medicine | Admitting: Rehabilitative and Restorative Service Providers"

## 2022-09-14 ENCOUNTER — Other Ambulatory Visit: Payer: Self-pay

## 2022-09-14 DIAGNOSIS — R2689 Other abnormalities of gait and mobility: Secondary | ICD-10-CM | POA: Diagnosis not present

## 2022-09-14 DIAGNOSIS — M25661 Stiffness of right knee, not elsewhere classified: Secondary | ICD-10-CM | POA: Diagnosis not present

## 2022-09-14 DIAGNOSIS — S82091D Other fracture of right patella, subsequent encounter for closed fracture with routine healing: Secondary | ICD-10-CM | POA: Diagnosis not present

## 2022-09-14 DIAGNOSIS — X58XXXD Exposure to other specified factors, subsequent encounter: Secondary | ICD-10-CM | POA: Insufficient documentation

## 2022-09-14 DIAGNOSIS — M6281 Muscle weakness (generalized): Secondary | ICD-10-CM

## 2022-09-14 DIAGNOSIS — M25561 Pain in right knee: Secondary | ICD-10-CM | POA: Insufficient documentation

## 2022-09-14 NOTE — Therapy (Signed)
OUTPATIENT PHYSICAL THERAPY LOWER EXTREMITY EVALUATION   Patient Name: Melanie Wilkinson MRN: [id number] DOB:1951-02-20, 72 y.o., female Today's Date: 09/14/2022  END OF SESSION:  PT End of Session - 09/14/22 1126     Visit Number 1    Number of Visits 16    Date for PT Re-Evaluation 11/09/22    Progress Note Due on Visit 10    PT Start Time 1020    PT Stop Time 1104    PT Time Calculation (min) 44 min    Activity Tolerance Patient tolerated treatment well             Past Medical History:  Diagnosis Date   Fibrocystic breast    Thyroid disease    Past Surgical History:  Procedure Laterality Date   BREAST BIOPSY     Patient Active Problem List   Diagnosis Date Noted   Fracture of patella, right, closed 08/23/2022   Well adult exam 01/19/2021   Osteoporosis 07/21/2020   Benign essential tremor 01/23/2020   Diverticula of colon 05/18/2017   Atrial septal aneurysm 05/16/2016   Back pain 04/06/2016   HLD (hyperlipidemia) 04/06/2016   Hypothyroidism 04/06/2016    PCP: Dr Everrett Coombe  REFERRING PROVIDER: Dr Rodney Langton   REFERRING DIAG: closed fx Rt patella   THERAPY DIAG:  Acute pain of right knee  Stiffness of right knee, not elsewhere classified  Muscle weakness (generalized)  Other abnormalities of gait and mobility  Rationale for Evaluation and Treatment: Rehabilitation  ONSET DATE: 08/04/22  SUBJECTIVE:   SUBJECTIVE STATEMENT: Patient reports that she tripped over her dogs chew bone and hit her Rt knee. She was placed in an immobilizer which was discontinued yesterday. She is now weight bearing as tolerated. Patient reports that she has had some pain and burning in the lateral Lt hip and thigh since fracture but this is resolving.    PERTINENT HISTORY: Unremarkable per pt report  PAIN:  Are you having pain? Yes: NPRS scale: 0/10 Pain location: Rt knee  Pain description: ache Aggravating factors: bending knee  Relieving factors: rest    PRECAUTIONS: None  WEIGHT BEARING RESTRICTIONS: No  FALLS:  Has patient fallen in last 6 months? Yes. 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  COGNITION: Overall cognitive status: Within functional limits for tasks assessed     SENSATION: WFL  EDEMA:  mild edema knee to ankle   MUSCLE LENGTH: Hamstrings: Right 80 deg; Left 90 deg  POSTURE: rounded shoulders, forward head, and weight shift left  PALPATION: Tender distal quad and peripatellar area Rt LE   LOWER EXTREMITY ROM:  Assessed in supine - hip and ankle ROM WFL's bilat  Active ROM Right eval Left eval  Hip flexion    Hip extension    Hip abduction  Hip adduction    Hip internal rotation    Hip external rotation    Knee flexion 72 135  Knee extension -5 0  Ankle dorsiflexion    Ankle plantarflexion    Ankle inversion    Ankle eversion     (Blank rows = not tested)  LOWER EXTREMITY MMT:   Rt LE not tested resistively - Lt LE WFL's   MMT Right eval Left eval  Hip flexion    Hip extension    Hip abduction    Hip adduction    Hip internal rotation    Hip external rotation    Knee flexion    Knee extension    Ankle dorsiflexion    Ankle plantarflexion    Ankle inversion    Ankle  eversion     (Blank rows = not tested)   GAIT: Distance walked: 50 Assistive device utilized: Crutches Level of assistance: Complete Independence Comments: WBAT Rt LE good gait pattern with crutches    TODAY'S TREATMENT:                                                                                                                              [facility] Adult PT Treatment:                                                DATE: 09/14/22 Therapeutic Exercise: Quad set towel under knee 5 sec x 5 SLR small range 5 sec hold x 5  Heel slide with strap 10 sec 5  Sidelying hip abduction leading with heel 3 sec x 5 Prone glut set 5 sec x 5 Knee flexion with strap 10 sec x 5 Sitting heel slide 10 sec x 5  Ankle pump/circles/alphabet LE elevated for HEP  Therapeutic Activity:  Gait:  Modalities: Will ice at home    PATIENT EDUCATION:  Education details: POC HEP  Person educated: Patient Education method: Explanation, Demonstration, Tactile cues, Verbal cues, and Handouts Education comprehension: verbalized understanding, returned demonstration, verbal cues required, tactile cues required, and needs further education  HOME EXERCISE PROGRAM: Access Code: [email or web address] URL: [email or web address] Date: 09/14/2022 Prepared by: Gillermo Murdoch  Exercises - Supine Heel Slide  - 2 x daily - 7 x weekly - 1 sets - 5-10 reps - 10 sec  hold - Supine Quad Set  - 2 x daily - 7 x weekly - 1 sets - 10 reps - 5 sec  hold - Small Range Straight Leg Raise  - 2 x daily - 7 x weekly - 1 sets - 10 reps - 5 sec  hold - Ankle Pumps in Elevation  - 2 x daily - 7 x weekly - 1 sets - 10-20 reps - Sidelying Hip Abduction  - 1 x daily - 7 x weekly - 3 sets - 10 reps -  3-5 sec  hold - Prone Gluteal Sets  - 2 x daily - 7 x weekly - 1 sets - 10 reps - 10 sec  hold - Prone Quadriceps Stretch with Strap  - 2 x daily - 7 x weekly - 1 sets - 5-10 reps - 10 sec  hold - Seated Knee Flexion Slide  - 2 x daily - 7 x  weekly - 1 sets - 5-10 reps - 10 sec  hold  ASSESSMENT:  CLINICAL IMPRESSION: Patient is a 72 y.o. female who was seen today for physical therapy evaluation and treatment for closed fracture Rt patella. Fracture sustained 08/04/22 with patient paced in an immobilizer after ~ 1 week which was discontinued 09/13/22. Patient is WBAT with axillary crutches and is independent in ambulation. She has limited AROM and functional strength Rt LE as well as limited ADL's due to fracture. Patient will benefit from PT to address problems identified.   OBJECTIVE IMPAIRMENTS: Abnormal gait; dependent gait; decreased AROM and strength Rt LE; edema Rt LE; decresaed activity tolerance   ACTIVITY LIMITATIONS: carrying, lifting, bending, sitting, standing, squatting, stairs, transfers, bed mobility, and locomotion level  PARTICIPATION LIMITATIONS: meal prep, cleaning, laundry, driving, shopping, community activity, yard work, and Press photographer POTENTIAL: Good  CLINICAL DECISION MAKING: Stable/uncomplicated  EVALUATION COMPLEXITY: Low   GOALS: Goals reviewed with patient? Yes  SHORT TERM GOALS: Target date: 10/12/2022   Independent in initial HEP  Baseline: Goal status: INITIAL  2.  Progress to least assistive device for safe ambulation  Baseline:  Goal status: INITIAL  LONG TERM GOALS: Target date: 11/09/2022   Increase AROM Rt knee flexion to 130 degrees and extension to 0  Baseline:  Goal status: INITIAL  2.  5/5 strength Rt LE  Baseline:  Goal status: INITIAL  3.  Independent in ambulation for community distances without assistive device Baseline:  Goal status: INITIAL  4.  Independent in HEP including qauatic program as indicated  Baseline:  Goal status: INITIAL    PLAN:  PT FREQUENCY: 2x/week  PT DURATION: 8 weeks  PLANNED INTERVENTIONS: Therapeutic exercises, Therapeutic activity, Neuromuscular re-education, Balance training, Gait training, Patient/Family education,  Self Care, Joint mobilization, Stair training, Aquatic Therapy, Dry Needling, Electrical stimulation, Cryotherapy, Moist heat, Taping, Vasopneumatic device, Ultrasound, Ionotophoresis 4mg /ml Dexamethasone, Manual therapy, and Re-evaluation  PLAN FOR NEXT SESSION: review and progress exercises to address ROM, strength, balance Rt LE as well as gait working toward gait without assistive device. Further assessment of Lt lateral hip pain and burning as indicated    Nolan Lasser P Helene Kelp, PT 09/14/2022, 11:27 AM

## 2022-09-20 ENCOUNTER — Ambulatory Visit: Payer: Medicare HMO

## 2022-09-20 DIAGNOSIS — M6281 Muscle weakness (generalized): Secondary | ICD-10-CM | POA: Diagnosis not present

## 2022-09-20 DIAGNOSIS — M25561 Pain in right knee: Secondary | ICD-10-CM | POA: Diagnosis not present

## 2022-09-20 DIAGNOSIS — R2689 Other abnormalities of gait and mobility: Secondary | ICD-10-CM

## 2022-09-20 DIAGNOSIS — X58XXXD Exposure to other specified factors, subsequent encounter: Secondary | ICD-10-CM | POA: Diagnosis not present

## 2022-09-20 DIAGNOSIS — M25661 Stiffness of right knee, not elsewhere classified: Secondary | ICD-10-CM | POA: Diagnosis not present

## 2022-09-20 DIAGNOSIS — S82091D Other fracture of right patella, subsequent encounter for closed fracture with routine healing: Secondary | ICD-10-CM | POA: Diagnosis not present

## 2022-09-20 NOTE — Therapy (Signed)
OUTPATIENT PHYSICAL THERAPY LOWER EXTREMITY EVALUATION   Patient Name: Melanie Wilkinson MRN: [id number] DOB:Apr 23, 1951, 72 y.o., female Today's Date: 09/20/2022  END OF SESSION:  PT End of Session - 09/20/22 1017     Visit Number 2    Number of Visits 16    Date for PT Re-Evaluation 11/09/22    PT Start Time 1018    PT Stop Time 1103    PT Time Calculation (min) 45 min             Past Medical History:  Diagnosis Date   Fibrocystic breast    Thyroid disease    Past Surgical History:  Procedure Laterality Date   BREAST BIOPSY     Patient Active Problem List   Diagnosis Date Noted   Fracture of patella, right, closed 08/23/2022   Well adult exam 01/19/2021   Osteoporosis 07/21/2020   Benign essential tremor 01/23/2020   Diverticula of colon 05/18/2017   Atrial septal aneurysm 05/16/2016   Back pain 04/06/2016   HLD (hyperlipidemia) 04/06/2016   Hypothyroidism 04/06/2016    PCP: Dr Luetta Nutting  REFERRING PROVIDER: Dr Aundria Mems   REFERRING DIAG: closed fx Rt patella   THERAPY DIAG:  Acute pain of right knee  Stiffness of right knee, not elsewhere classified  Muscle weakness (generalized)  Other abnormalities of gait and mobility  Rationale for Evaluation and Treatment: Rehabilitation  ONSET DATE: 08/04/22  SUBJECTIVE:   SUBJECTIVE STATEMENT: Patient arrived to session with [facility], states she transitioned herself from crutches to cane since last visit. Patient reports knee pain increases to 3/10 when extending knee during heel slides.  PERTINENT HISTORY: Unremarkable per pt report  PAIN:  Are you having pain? Yes: NPRS scale: 0/10 Pain location: Rt knee  Pain description: ache Aggravating factors: bending knee  Relieving factors: rest   PRECAUTIONS: None  WEIGHT BEARING RESTRICTIONS: No  FALLS:  Has patient fallen in last 6 months? Yes. 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  COGNITION: Overall cognitive status: Within functional limits for tasks assessed     SENSATION: WFL  EDEMA:  mild edema knee to ankle   MUSCLE LENGTH: Hamstrings: Right 80 deg; Left 90 deg  POSTURE: rounded shoulders, forward head, and weight shift left  PALPATION: Tender distal quad and peripatellar area Rt LE   LOWER EXTREMITY ROM:  Assessed in supine - hip and ankle ROM WFL's bilat  Active ROM Right eval Left eval  Hip flexion    Hip extension    Hip abduction    Hip adduction    Hip internal rotation    Hip external rotation    Knee flexion 72 135  Knee extension -5 0  Ankle dorsiflexion    Ankle plantarflexion    Ankle inversion    Ankle eversion     (  Blank rows = not tested)  LOWER EXTREMITY MMT:   Rt LE not tested resistively - Lt LE WFL's   MMT Right eval Left eval  Hip flexion    Hip extension    Hip abduction    Hip adduction    Hip internal rotation    Hip external rotation    Knee flexion    Knee extension    Ankle dorsiflexion    Ankle plantarflexion    Ankle inversion    Ankle eversion     (Blank rows = not tested)   GAIT: Distance walked: 50 Assistive device utilized: Crutches Level of assistance: Complete Independence Comments: WBAT Rt LE good gait pattern with  crutches    TODAY'S TREATMENT:     [facility] Adult PT Treatment:                                                DATE: 09/20/2022 Therapeutic Exercise: Heel slides with strap 10x5" Quad set towel under knee 10x5" 3-way SLR x10 each (parallel, IR, ER) HS/ITB stretches w/strap 2x30" B S/L hip abduction leading with heel x10 S/L pilates leg kick series B Prone glute set 5x5" Prone hip extension x10 B Prone R TKE 2x10 Modified thomas stretch x30" B Seated knee AROM (rolling pool noodle with feet) Slantboard gastroc stretch 2x30" Recumbent bike x 5 min                                                                                                                              [facility] Adult PT Treatment:                                                DATE: 09/14/22 Therapeutic Exercise: Quad set towel under knee 5 sec x 5 SLR small range 5 sec hold x 5  Heel slide with strap 10 sec 5  Sidelying hip abduction leading with heel 3 sec x 5 Prone glut set 5 sec x 5 Knee flexion with strap 10 sec x 5 Sitting heel slide 10 sec x 5  Ankle pump/circles/alphabet LE elevated for HEP  Therapeutic Activity:  Gait:  Modalities: Will ice at home    PATIENT EDUCATION:  Education details: Aquatic exercises (walking), stationary bike  Person educated: Patient Education method: Explanation, Demonstration, Tactile cues, Verbal cues, and Handouts Education comprehension: verbalized understanding, returned demonstration, verbal cues required, tactile cues required, and needs further education  HOME EXERCISE PROGRAM: Access Code: [email or web address] URL: [email or web address] Date: 09/14/2022 Prepared by: Gillermo Murdoch  Exercises - Supine Heel Slide  - 2 x daily - 7 x weekly - 1 sets - 5-10 reps - 10 sec  hold - Supine Quad Set  -  2 x daily - 7 x weekly - 1 sets - 10 reps - 5 sec  hold - Small Range Straight Leg Raise  - 2 x daily - 7 x weekly - 1 sets - 10 reps - 5 sec  hold - Ankle Pumps in  Elevation  - 2 x daily - 7 x weekly - 1 sets - 10-20 reps - Sidelying Hip Abduction  - 1 x daily - 7 x weekly - 3 sets - 10 reps - 3-5 sec  hold - Prone Gluteal Sets  - 2 x daily - 7 x weekly - 1 sets - 10 reps - 10 sec  hold - Prone Quadriceps Stretch with Strap  - 2 x daily - 7 x weekly - 1 sets - 5-10 reps - 10 sec  hold - Seated Knee Flexion Slide  - 2 x daily - 7 x weekly - 1 sets - 5-10 reps - 10 sec  hold  ASSESSMENT:  CLINICAL IMPRESSION: Increased lateral knee pain during active knee extension during assisted heel slides. Tactile cues provided to improve postural stability and pelvic alignment during side lying exercises. Improved knee AROM demonstrated on recumbent bike at end of session as compared to beginning heel slides.  OBJECTIVE IMPAIRMENTS: Abnormal gait; dependent gait; decreased AROM and strength Rt LE; edema Rt LE; decresaed activity tolerance   ACTIVITY LIMITATIONS: carrying, lifting, bending, sitting, standing, squatting, stairs, transfers, bed mobility, and locomotion level  PARTICIPATION LIMITATIONS: meal prep, cleaning, laundry, driving, shopping, community activity, yard work, and International aid/development worker POTENTIAL: Good  CLINICAL DECISION MAKING: Stable/uncomplicated  EVALUATION COMPLEXITY: Low   GOALS: Goals reviewed with patient? Yes  SHORT TERM GOALS: Target date: 10/12/2022   Independent in initial HEP  Baseline: Goal status: INITIAL  2.  Progress to least assistive device for safe ambulation  Baseline:  Goal status: INITIAL  LONG TERM GOALS: Target date: 11/09/2022   Increase AROM Rt knee flexion to 130 degrees and extension to 0  Baseline:  Goal status: INITIAL  2.  5/5 strength Rt LE  Baseline:  Goal status: INITIAL  3.  Independent in ambulation for community distances without assistive device Baseline:  Goal status: INITIAL  4.  Independent in HEP including qauatic program as indicated  Baseline:  Goal status:  INITIAL    PLAN:  PT FREQUENCY: 2x/week  PT DURATION: 8 weeks  PLANNED INTERVENTIONS: Therapeutic exercises, Therapeutic activity, Neuromuscular re-education, Balance training, Gait training, Patient/Family education, Self Care, Joint mobilization, Stair training, Aquatic Therapy, Dry Needling, Electrical stimulation, Cryotherapy, Moist heat, Taping, Vasopneumatic device, Ultrasound, Ionotophoresis 4mg /ml Dexamethasone, Manual therapy, and Re-evaluation  PLAN FOR NEXT SESSION: Recumbent bike warm up; Progress exercises to address ROM, strength, balance Rt LE as well as gait working toward gait without assistive device. Further assessment of Lt lateral hip pain and burning as indicated    , PTA 09/20/2022, 11:06 AM

## 2022-09-22 ENCOUNTER — Encounter: Payer: Self-pay | Admitting: Physical Therapy

## 2022-09-22 ENCOUNTER — Ambulatory Visit: Payer: Medicare HMO | Admitting: Physical Therapy

## 2022-09-22 DIAGNOSIS — R2689 Other abnormalities of gait and mobility: Secondary | ICD-10-CM | POA: Diagnosis not present

## 2022-09-22 DIAGNOSIS — X58XXXD Exposure to other specified factors, subsequent encounter: Secondary | ICD-10-CM | POA: Diagnosis not present

## 2022-09-22 DIAGNOSIS — M6281 Muscle weakness (generalized): Secondary | ICD-10-CM

## 2022-09-22 DIAGNOSIS — M25661 Stiffness of right knee, not elsewhere classified: Secondary | ICD-10-CM

## 2022-09-22 DIAGNOSIS — M25561 Pain in right knee: Secondary | ICD-10-CM

## 2022-09-22 DIAGNOSIS — S82091D Other fracture of right patella, subsequent encounter for closed fracture with routine healing: Secondary | ICD-10-CM | POA: Diagnosis not present

## 2022-09-22 NOTE — Therapy (Signed)
OUTPATIENT PHYSICAL THERAPY TREATMENT   Patient Name: Melanie Wilkinson MRN: [id number] DOB:1951/01/08, 72 y.o., female Today's Date: 09/22/2022  END OF SESSION:  PT End of Session - 09/22/22 1446     Visit Number 3    Number of Visits 16    Date for PT Re-Evaluation 11/09/22    PT Start Time 1446    PT Stop Time 9735    PT Time Calculation (min) 44 min    Activity Tolerance Patient tolerated treatment well    Behavior During Therapy [facility] for tasks assessed/performed              Past Medical History:  Diagnosis Date   Fibrocystic breast    Thyroid disease    Past Surgical History:  Procedure Laterality Date   BREAST BIOPSY     Patient Active Problem List   Diagnosis Date Noted   Fracture of patella, right, closed 08/23/2022   Well adult exam 01/19/2021   Osteoporosis 07/21/2020   Benign essential tremor 01/23/2020   Diverticula of colon 05/18/2017   Atrial septal aneurysm 05/16/2016   Back pain 04/06/2016   HLD (hyperlipidemia) 04/06/2016   Hypothyroidism 04/06/2016    PCP: Dr Luetta Nutting  REFERRING PROVIDER: Dr Aundria Mems   REFERRING DIAG: closed fx Rt patella   THERAPY DIAG:  Acute pain of right knee  Stiffness of right knee, not elsewhere classified  Muscle weakness (generalized)  Other abnormalities of gait and mobility  Rationale for Evaluation and Treatment: Rehabilitation  ONSET DATE: 08/04/22  SUBJECTIVE:   SUBJECTIVE STATEMENT: Pt arrives with no cane -- states she has been able to bend the knee more. Pt reports she had gum surgery earlier today. No issues with her HEP.   PERTINENT HISTORY: Unremarkable per pt report   PAIN:  Are you having pain? Yes: NPRS scale: 0/10 Pain location: Rt knee  Pain description: ache Aggravating factors: bending knee  Relieving factors: rest   PRECAUTIONS: None  WEIGHT BEARING RESTRICTIONS: No  FALLS:  Has patient fallen in last 6 months? Yes. Number of falls 1  LIVING  ENVIRONMENT: Lives with: lives with their spouse Lives in: House/apartment Stairs: Yes: Internal: 14 steps; can reach both and External: 3 steps; can reach both Has following equipment at home: [place]: retired from [employer] 34 years retired 6 yr ago    Household chores; bodyblade work out 5x/wk; Firefighter; crocheting; walking 2-3 miles per day walking dog   PLOF: Independent  PATIENT GOALS: walk without crutches and bend knee without pain   NEXT MD VISIT: 10/25/22 with Dr. Darene Lamer  OBJECTIVE:   DIAGNOSTIC FINDINGS: xray 09/13/22: On the provided frontal and lateral views, the original fracture coronal lucency overlying the patella on frontal view is less well visualized, consistent with at least partial healing sclerosis.  EDEMA:  mild edema knee to ankle   MUSCLE LENGTH: Hamstrings: Right 80 deg; Left 90 deg  POSTURE: rounded shoulders, forward head, and weight shift left  PALPATION: Tender distal quad and peripatellar area Rt LE   LOWER EXTREMITY ROM:  Assessed in supine - hip and ankle ROM WFL's bilat  Active ROM Right eval Left eval Right   Hip flexion     Hip extension     Hip abduction     Hip adduction     Hip internal rotation     Hip external rotation     Knee flexion 72 135 118  Knee extension -5 0 -5  Ankle dorsiflexion  Ankle plantarflexion     Ankle inversion     Ankle eversion      (Blank rows = not tested)  LOWER EXTREMITY MMT:   Rt LE not tested resistively - Lt LE WFL's   MMT Right eval Left eval  Hip flexion    Hip extension    Hip abduction    Hip adduction    Hip internal rotation    Hip external rotation    Knee flexion    Knee extension    Ankle dorsiflexion    Ankle plantarflexion    Ankle inversion    Ankle eversion     (Blank rows = not tested)  GAIT: Distance walked: 50 Assistive device utilized: Crutches Level of assistance: Complete Independence Comments: WBAT Rt LE good gait pattern with crutches     [facility] Adult PT Treatment:                                                DATE: 09/22/22 Therapeutic Exercise: Heel slides with strap 10x5" Quad set 10x3" Recumbent bike L1 x 5 min warm up Standing hip abd 3 way with slider 2x10 R&L 4" forward step ups 3x10 alternating initially with bilat UE support and then none 4" side step ups 3x10 R&L Standing terminal knee ext into ball 2x10x5" Leg press DL 65# 2x10    [facility] Adult PT Treatment:                                                DATE: 09/20/2022 Therapeutic Exercise: Heel slides with strap 10x5" Quad set towel under knee 10x5" 3-way SLR x10 each (parallel, IR, ER) HS/ITB stretches w/strap 2x30" B S/L hip abduction leading with heel x10 S/L pilates leg kick series B Prone glute set 5x5" Prone hip extension x10 B Prone R TKE 2x10 Modified thomas stretch x30" B Seated knee AROM (rolling pool noodle with feet) Slantboard gastroc stretch 2x30" Recumbent bike x 5 min                                                                                                                              [facility] Adult PT Treatment:                                                DATE: 09/14/22 Therapeutic Exercise: Quad set towel under knee 5 sec x 5 SLR small range 5 sec hold x 5  Heel slide with strap 10 sec 5  Sidelying hip abduction leading with heel 3 sec x 5 Prone  glut set 5 sec x 5 Knee flexion with strap 10 sec x 5 Sitting heel slide 10 sec x 5  Ankle pump/circles/alphabet LE elevated for HEP  Modalities: Will ice at home    PATIENT EDUCATION:  Education details: Aquatic exercises (walking), stationary bike  Person educated: Patient Education method: Explanation, Demonstration, Tactile cues, Verbal cues, and Handouts Education comprehension: verbalized understanding, returned demonstration, verbal cues required, tactile cues required, and needs further education  HOME EXERCISE PROGRAM: Access Code: [email or web address] URL:  [email or web address] Date: 09/14/2022 Prepared by: Corlis Leak  Exercises - Supine Heel Slide  - 2 x daily - 7 x weekly - 1 sets - 5-10 reps - 10 sec  hold - Supine Quad Set  - 2 x daily - 7 x weekly - 1 sets - 10 reps - 5 sec  hold - Small Range Straight Leg Raise  - 2 x daily - 7 x weekly - 1 sets - 10 reps - 5 sec  hold - Ankle Pumps in Elevation  - 2 x daily - 7 x weekly - 1 sets - 10-20 reps - Sidelying Hip Abduction  - 1 x daily - 7 x weekly - 3 sets - 10 reps - 3-5 sec  hold - Prone Gluteal Sets  - 2 x daily - 7 x weekly - 1 sets - 10 reps - 10 sec  hold - Prone Quadriceps Stretch with Strap  - 2 x daily - 7 x weekly - 1 sets - 5-10 reps - 10 sec  hold - Seated Knee Flexion Slide  - 2 x daily - 7 x weekly - 1 sets - 5-10 reps - 10 sec  hold  ASSESSMENT:  CLINICAL IMPRESSION: Focused on improving hip and knee strength. Increased time spent in standing this session. Working on improving single leg stability. She is demonstrating good gains in knee ROM and gait. Decreased calf tightness noted.   OBJECTIVE IMPAIRMENTS: Abnormal gait; dependent gait; decreased AROM and strength Rt LE; edema Rt LE; decresaed activity tolerance    GOALS: Goals reviewed with patient? Yes  SHORT TERM GOALS: Target date: 10/12/2022   Independent in initial HEP  Baseline: Goal status: INITIAL  2.  Progress to least assistive device for safe ambulation  Baseline:  Goal status: INITIAL  LONG TERM GOALS: Target date: 11/09/2022   Increase AROM Rt knee flexion to 130 degrees and extension to 0  Baseline:  Goal status: INITIAL  2.  5/5 strength Rt LE  Baseline:  Goal status: INITIAL  3.  Independent in ambulation for community distances without assistive device Baseline:  Goal status: INITIAL  4.  Independent in HEP including qauatic program as indicated  Baseline:  Goal status: INITIAL    PLAN:  PT FREQUENCY: 2x/week  PT DURATION: 8 weeks  PLANNED INTERVENTIONS:  Therapeutic exercises, Therapeutic activity, Neuromuscular re-education, Balance training, Gait training, Patient/Family education, Self Care, Joint mobilization, Stair training, Aquatic Therapy, Dry Needling, Electrical stimulation, Cryotherapy, Moist heat, Taping, Vasopneumatic device, Ultrasound, Ionotophoresis 4mg /ml Dexamethasone, Manual therapy, and Re-evaluation  PLAN FOR NEXT SESSION: Recumbent bike warm up; Progress exercises to address ROM, strength, balance Rt LE as well as gait working toward gait without assistive device. Further assessment of Lt lateral hip pain and burning as indicated    Chia Rock April Ma L Eusebia Grulke, PT 09/22/2022, 2:47 PM

## 2022-09-27 ENCOUNTER — Ambulatory Visit: Payer: Medicare HMO

## 2022-09-27 DIAGNOSIS — M6281 Muscle weakness (generalized): Secondary | ICD-10-CM

## 2022-09-27 DIAGNOSIS — M25561 Pain in right knee: Secondary | ICD-10-CM | POA: Diagnosis not present

## 2022-09-27 DIAGNOSIS — S82091D Other fracture of right patella, subsequent encounter for closed fracture with routine healing: Secondary | ICD-10-CM | POA: Diagnosis not present

## 2022-09-27 DIAGNOSIS — R2689 Other abnormalities of gait and mobility: Secondary | ICD-10-CM

## 2022-09-27 DIAGNOSIS — M25661 Stiffness of right knee, not elsewhere classified: Secondary | ICD-10-CM | POA: Diagnosis not present

## 2022-09-27 DIAGNOSIS — X58XXXD Exposure to other specified factors, subsequent encounter: Secondary | ICD-10-CM | POA: Diagnosis not present

## 2022-09-27 NOTE — Therapy (Signed)
OUTPATIENT PHYSICAL THERAPY TREATMENT   Patient Name: Melanie Wilkinson MRN: [id number] DOB:1950-12-13, 72 y.o., female Today's Date: 09/27/2022  END OF SESSION:  PT End of Session - 09/27/22 1016     Visit Number 4    Number of Visits 16    Date for PT Re-Evaluation 11/09/22    PT Start Time 1016    PT Stop Time 1100    PT Time Calculation (min) 44 min    Activity Tolerance Patient tolerated treatment well    Behavior During Therapy [facility] for tasks assessed/performed              Past Medical History:  Diagnosis Date   Fibrocystic breast    Thyroid disease    Past Surgical History:  Procedure Laterality Date   BREAST BIOPSY     Patient Active Problem List   Diagnosis Date Noted   Fracture of patella, right, closed 08/23/2022   Well adult exam 01/19/2021   Osteoporosis 07/21/2020   Benign essential tremor 01/23/2020   Diverticula of colon 05/18/2017   Atrial septal aneurysm 05/16/2016   Back pain 04/06/2016   HLD (hyperlipidemia) 04/06/2016   Hypothyroidism 04/06/2016    PCP: Dr Luetta Nutting  REFERRING PROVIDER: Dr Aundria Mems   REFERRING DIAG: closed fx Rt patella   THERAPY DIAG:  Acute pain of right knee  Stiffness of right knee, not elsewhere classified  Muscle weakness (generalized)  Other abnormalities of gait and mobility  Rationale for Evaluation and Treatment: Rehabilitation  ONSET DATE: 08/04/22  SUBJECTIVE:   SUBJECTIVE STATEMENT: Patient reports she drove herself to therapy today, states she has very mild pain - describes it more as "something's not right". Patient states she has only been elevating leg due to mild swelling in ankle but no ice.  PERTINENT HISTORY: Unremarkable per pt report   PAIN:  Are you having pain? Yes: NPRS scale: 0/10 Pain location: Rt knee  Pain description: ache Aggravating factors: bending knee  Relieving factors: rest   PRECAUTIONS: None  WEIGHT BEARING RESTRICTIONS: No  FALLS:  Has patient  fallen in last 6 months? Yes. 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 Darene Lamer  OBJECTIVE:   DIAGNOSTIC FINDINGS: xray 09/13/22: On the provided frontal and lateral views, the original fracture coronal lucency overlying the patella on frontal view is less well visualized, consistent with at least partial healing sclerosis.  EDEMA:  mild edema knee to ankle   MUSCLE LENGTH: Hamstrings: Right 80 deg; Left 90 deg  POSTURE: rounded shoulders, forward head, and weight shift left  PALPATION: Tender distal quad and peripatellar area Rt LE   LOWER EXTREMITY ROM:  Assessed in supine - hip and ankle ROM WFL's bilat  Active ROM Right eval Left eval Right   Hip flexion     Hip extension     Hip abduction     Hip adduction     Hip internal rotation     Hip external rotation     Knee flexion 72 135 118  Knee extension -5 0 -5  Ankle dorsiflexion     Ankle plantarflexion     Ankle inversion     Ankle eversion      (Blank rows = not tested)  LOWER EXTREMITY MMT:   Rt LE not tested resistively - Lt LE WFL's   MMT Right eval Left eval  Hip flexion    Hip extension    Hip abduction    Hip adduction    Hip internal rotation    Hip external rotation    Knee flexion    Knee extension    Ankle dorsiflexion    Ankle plantarflexion    Ankle inversion    Ankle eversion     (Blank rows = not tested)  GAIT: Distance walked: 50 Assistive device utilized: Crutches Level of assistance: Complete  Independence Comments: WBAT Rt LE good gait pattern with crutches    [facility] Adult PT Treatment:                                                DATE: 09/27/2022 Therapeutic Exercise: Recumbent bike L3 x 87min Quad set 10x3" SAQ R 10x3" Prone HS curl --> standing HS curl Standing hip abd & ext RTB 2x10 Fwd/bkwd amb 1L each Standing squats RTB above knees x10 S/L clamshells RTB 2x10 S/L straight leg hip abd 2x10 Bent knee fall out RTB x15B Bridges + hip abd RTB x10 Small ROM SLR: parallel, ER, IR x10 each Standing TKE into ball 2x10x5" 4" side step ups x10 --> 6" step ups x15 6" side step ups x15 R&L Mini squats x10 Prone hip extension x10 B Leg press DL 65# 2x10   [facility] Adult PT Treatment:                                                DATE: 09/22/22 Therapeutic Exercise: Heel slides with strap 10x5" Quad set 10x3" Recumbent bike L1 x 5 min warm up Standing hip abd 3 way with slider 2x10 R&L 4" forward step ups 3x10 alternating initially with bilat UE support and then none 4" side step ups 3x10 R&L Standing terminal knee ext into ball 2x10x5" Leg press DL 65# 2x10   PATIENT EDUCATION:  Education details: Progress HEP  Person educated: Patient Education method: Explanation, Demonstration, Tactile cues, Verbal cues, and Handouts Education comprehension: verbalized understanding, returned demonstration, verbal cues required, tactile cues required, and needs further education  HOME EXERCISE PROGRAM: Access Code: [email or web address] URL: [email or web address] Date: 09/27/2022 Prepared by: Helane Gunther  Exercises - Supine Heel Slide  - 2 x daily - 7 x weekly - 1 sets - 5-10 reps - 10 sec  hold - Supine Quad Set  - 2 x daily - 7 x weekly - 1 sets - 10 reps - 5 sec  hold - Small Range Straight Leg Raise  - 2 x daily - 7 x weekly - 1 sets - 10 reps - 5 sec  hold - Ankle Pumps in Elevation  - 2 x daily - 7 x weekly - 1 sets - 10-20 reps - Sidelying Hip Abduction  - 1 x daily  - 7 x weekly - 3 sets - 10 reps - 3-5 sec  hold - Prone Gluteal  Sets  - 2 x daily - 7 x weekly - 1 sets - 10 reps - 10 sec  hold - Prone Quadriceps Stretch with Strap  - 2 x daily - 7 x weekly - 1 sets - 5-10 reps - 10 sec  hold - Seated Knee Flexion Slide  - 2 x daily - 7 x weekly - 1 sets - 5-10 reps - 10 sec  hold - Small Range Straight Leg Raise  - 1 x daily - 7 x weekly - 3 sets - 10 reps - Standing Hip Extension Kicks  - 1 x daily - 7 x weekly - 3 sets - 10 reps - Standing Hip Abduction Kicks  - 1 x daily - 7 x weekly - 3 sets - 10 reps - Mini Squat with Counter Support  - 1 x daily - 7 x weekly - 3 sets - 10 reps  ASSESSMENT:  CLINICAL IMPRESSION: R LE offloading demonstrated during standing squats; cueing smaller range of motion improved equal weight bearing between bilateral legs. Dynamic single leg balance progressed to higher step and decreased hand hold assist. Initial mild postural sway exhibited during backwards walking, however stability improved over time.    OBJECTIVE IMPAIRMENTS: Abnormal gait; dependent gait; decreased AROM and strength Rt LE; edema Rt LE; decresaed activity tolerance    GOALS: Goals reviewed with patient? Yes  SHORT TERM GOALS: Target date: 10/12/2022   Independent in initial HEP  Baseline: Goal status: INITIAL  2.  Progress to least assistive device for safe ambulation  Baseline:  Goal status: INITIAL  LONG TERM GOALS: Target date: 11/09/2022   Increase AROM Rt knee flexion to 130 degrees and extension to 0  Baseline:  Goal status: INITIAL  2.  5/5 strength Rt LE  Baseline:  Goal status: INITIAL  3.  Independent in ambulation for community distances without assistive device Baseline:  Goal status: INITIAL  4.  Independent in HEP including qauatic program as indicated  Baseline:  Goal status: INITIAL    PLAN:  PT FREQUENCY: 2x/week  PT DURATION: 8 weeks  PLANNED INTERVENTIONS: Therapeutic exercises, Therapeutic activity,  Neuromuscular re-education, Balance training, Gait training, Patient/Family education, Self Care, Joint mobilization, Stair training, Aquatic Therapy, Dry Needling, Electrical stimulation, Cryotherapy, Moist heat, Taping, Vasopneumatic device, Ultrasound, Ionotophoresis 4mg /ml Dexamethasone, Manual therapy, and Re-evaluation  PLAN FOR NEXT SESSION: Recumbent bike warm up; Progress exercises to address ROM, strength, balance.  , PTA 09/27/2022, 11:06 AM

## 2022-09-29 ENCOUNTER — Ambulatory Visit: Payer: Medicare HMO

## 2022-09-29 DIAGNOSIS — R2689 Other abnormalities of gait and mobility: Secondary | ICD-10-CM | POA: Diagnosis not present

## 2022-09-29 DIAGNOSIS — M25661 Stiffness of right knee, not elsewhere classified: Secondary | ICD-10-CM

## 2022-09-29 DIAGNOSIS — S82091D Other fracture of right patella, subsequent encounter for closed fracture with routine healing: Secondary | ICD-10-CM | POA: Diagnosis not present

## 2022-09-29 DIAGNOSIS — M6281 Muscle weakness (generalized): Secondary | ICD-10-CM

## 2022-09-29 DIAGNOSIS — M25561 Pain in right knee: Secondary | ICD-10-CM | POA: Diagnosis not present

## 2022-09-29 DIAGNOSIS — X58XXXD Exposure to other specified factors, subsequent encounter: Secondary | ICD-10-CM | POA: Diagnosis not present

## 2022-09-29 NOTE — Therapy (Signed)
OUTPATIENT PHYSICAL THERAPY TREATMENT   Patient Name: Melanie Wilkinson MRN: [id number] DOB:June 23, 1951, 72 y.o., female Today's Date: 09/29/2022  END OF SESSION:  PT End of Session - 09/29/22 1106     Visit Number 5    Number of Visits 16    Date for PT Re-Evaluation 11/09/22    PT Start Time 1105    PT Stop Time 1145    PT Time Calculation (min) 40 min    Activity Tolerance Patient tolerated treatment well    Behavior During Therapy [facility] for tasks assessed/performed              Past Medical History:  Diagnosis Date   Fibrocystic breast    Thyroid disease    Past Surgical History:  Procedure Laterality Date   BREAST BIOPSY     Patient Active Problem List   Diagnosis Date Noted   Fracture of patella, right, closed 08/23/2022   Well adult exam 01/19/2021   Osteoporosis 07/21/2020   Benign essential tremor 01/23/2020   Diverticula of colon 05/18/2017   Atrial septal aneurysm 05/16/2016   Back pain 04/06/2016   HLD (hyperlipidemia) 04/06/2016   Hypothyroidism 04/06/2016    PCP: Dr Luetta Nutting  REFERRING PROVIDER: Dr Aundria Mems   REFERRING DIAG: closed fx Rt patella   THERAPY DIAG:  Acute pain of right knee  Stiffness of right knee, not elsewhere classified  Muscle weakness (generalized)  Other abnormalities of gait and mobility  Rationale for Evaluation and Treatment: Rehabilitation  ONSET DATE: 08/04/22  SUBJECTIVE:   SUBJECTIVE STATEMENT: Patient reports the chiropractor told her her hip is out of alignment and wants to give her five re-alignment treatment. Patient reports swelling in ankle is pretty much gone; states she has stiffness in ankle first   PERTINENT HISTORY: Unremarkable per pt report   PAIN:  Are you having pain? Yes: NPRS scale: 0/10 Pain location: Rt knee  Pain description: ache Aggravating factors: bending knee  Relieving factors: rest   PRECAUTIONS: None  WEIGHT BEARING RESTRICTIONS: No  FALLS:  Has patient  fallen in last 6 months? Yes. 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 Darene Lamer  OBJECTIVE:   DIAGNOSTIC FINDINGS: xray 09/13/22: On the provided frontal and lateral views, the original fracture coronal lucency overlying the patella on frontal view is less well visualized, consistent with at least partial healing sclerosis.  EDEMA:  mild edema knee to ankle   MUSCLE LENGTH: Hamstrings: Right 80 deg; Left 90 deg  POSTURE: rounded shoulders, forward head, and weight shift left  PALPATION: Tender distal quad and peripatellar area Rt LE   LOWER EXTREMITY ROM:  Assessed in supine - hip and ankle ROM WFL's bilat  Active ROM Right eval Left eval Right   Hip flexion     Hip extension     Hip abduction     Hip adduction     Hip internal rotation     Hip external rotation     Knee flexion 72 135 118  Knee extension -  5 0 -5  Ankle dorsiflexion     Ankle plantarflexion     Ankle inversion     Ankle eversion      (Blank rows = not tested)  LOWER EXTREMITY MMT:   Rt LE not tested resistively - Lt LE WFL's   MMT Right eval Left eval  Hip flexion    Hip extension    Hip abduction    Hip adduction    Hip internal rotation    Hip external rotation    Knee flexion    Knee extension    Ankle dorsiflexion    Ankle plantarflexion    Ankle inversion    Ankle eversion     (Blank rows = not tested)  GAIT: Distance walked: 50 Assistive device utilized: Crutches Level of assistance: Complete  Independence Comments: WBAT Rt LE good gait pattern with crutches    [facility] Adult PT Treatment:                                                DATE: 09/29/2022 Therapeutic Exercise: Recumbent bike L5 x 19min Prone TKE/quad set R x10 S/L clamshells RTB x10, GTB x10 S/L bent knee hip abd GTB x15 Bent knee fall out GTB 2x10 B STS GTB above knees x10 Seated knee extension GTB 2x10 R Standing mini squat with GTB above knees x10 Standing R TKE GTB x10 Bent knee side stepping GTB above knees (counter) 4L   [facility] Adult PT Treatment:                                                DATE: 09/27/2022 Therapeutic Exercise: Recumbent bike L3 x 35min Quad set 10x3" SAQ R 10x3" Prone HS curl --> standing HS curl Standing hip abd & ext RTB 2x10 Fwd/bkwd amb 1L each Standing squats RTB above knees x10 S/L clamshells RTB 2x10 S/L straight leg hip abd 2x10 Bent knee fall out RTB x15B Bridges + hip abd RTB x10 Small ROM SLR: parallel, ER, IR x10 each Standing TKE into ball 2x10x5" 4" side step ups x10 --> 6" step ups x15 6" side step ups x15 R&L Mini squats x10 Prone hip extension x10 B Leg press DL 65# 2x10   PATIENT EDUCATION:  Education details: Walking on even surfaces  Person educated: Patient Education method: Consulting civil engineer, Demonstration, Corporate treasurer cues, Verbal cues, and Handouts Education comprehension: verbalized understanding, returned demonstration, verbal cues required, tactile cues required, and needs further education  HOME EXERCISE PROGRAM: Access Code: [email or web address] URL: [email or web address] Date: 09/29/2022 Prepared by: Helane Gunther  Exercises - Supine Heel Slide  - 2 x daily - 7 x weekly - 1 sets - 5-10 reps - 10 sec  hold - Supine Quad Set  - 2 x daily - 7 x weekly - 1 sets - 10 reps - 5 sec  hold - Small Range Straight Leg Raise  - 2 x daily - 7 x weekly - 1 sets - 10 reps - 5 sec  hold - Ankle Pumps in Elevation  - 2 x daily - 7 x weekly - 1 sets - 10-20 reps -  Sidelying Hip Abduction  - 1 x daily - 7 x weekly - 3 sets - 10 reps - 3-5  sec  hold - Prone Gluteal Sets  - 2 x daily - 7 x weekly - 1 sets - 10 reps - 10 sec  hold - Prone Quadriceps Stretch with Strap  - 2 x daily - 7 x weekly - 1 sets - 5-10 reps - 10 sec  hold - Seated Knee Flexion Slide  - 2 x daily - 7 x weekly - 1 sets - 5-10 reps - 10 sec  hold - Small Range Straight Leg Raise  - 1 x daily - 7 x weekly - 3 sets - 10 reps - Standing Hip Extension Kicks  - 1 x daily - 7 x weekly - 3 sets - 10 reps - Standing Hip Abduction Kicks  - 1 x daily - 7 x weekly - 3 sets - 10 reps - Mini Squat with Counter Support  - 1 x daily - 7 x weekly - 3 sets - 10 reps - Clam with Resistance  - 1 x daily - 7 x weekly - 3 sets - 10 reps - Bridge with Hip Abduction and Resistance - Ground Touches  - 1 x daily - 7 x weekly - 3 sets - 10 reps - Seated Knee Extension with Resistance  - 1 x daily - 7 x weekly - 3 sets - 10 reps - Standing Terminal Knee Extension with Resistance  - 1 x daily - 7 x weekly - 3 sets - 10 reps - Sidestepping in Squat with Resistance and Arms Forward  - 1 x daily - 7 x weekly - 3 sets - 10 reps  ASSESSMENT:  CLINICAL IMPRESSION: Resistance increased during side lying clamshells and bridges with hip abd. Resistance band placed above knees during sit tot stand exercise improved hip stabilization and decreased knee valgus. Patient able to progress to resisted side stepping with no exacerbation of knee pain.  OBJECTIVE IMPAIRMENTS: Abnormal gait; dependent gait; decreased AROM and strength Rt LE; edema Rt LE; decresaed activity tolerance    GOALS: Goals reviewed with patient? Yes  SHORT TERM GOALS: Target date: 10/12/2022   Independent in initial HEP  Baseline: Goal status: INITIAL  2.  Progress to least assistive device for safe ambulation  Baseline:  Goal status: INITIAL  LONG TERM GOALS: Target date: 11/09/2022   Increase AROM Rt knee flexion to 130 degrees and extension to  0  Baseline:  Goal status: INITIAL  2.  5/5 strength Rt LE  Baseline:  Goal status: INITIAL  3.  Independent in ambulation for community distances without assistive device Baseline:  Goal status: INITIAL  4.  Independent in HEP including qauatic program as indicated  Baseline:  Goal status: INITIAL    PLAN:  PT FREQUENCY: 2x/week  PT DURATION: 8 weeks  PLANNED INTERVENTIONS: Therapeutic exercises, Therapeutic activity, Neuromuscular re-education, Balance training, Gait training, Patient/Family education, Self Care, Joint mobilization, Stair training, Aquatic Therapy, Dry Needling, Electrical stimulation, Cryotherapy, Moist heat, Taping, Vasopneumatic device, Ultrasound, Ionotophoresis 4mg /ml Dexamethasone, Manual therapy, and Re-evaluation  PLAN FOR NEXT SESSION: Follow up on HEP; Progress exercises to address ROM, strength, balance.  , PTA 09/29/2022, 11:48 AM

## 2022-09-29 NOTE — Therapy (Deleted)
OUTPATIENT PHYSICAL THERAPY TREATMENT   Patient Name: Melanie Wilkinson MRN: [id number] DOB:1950-12-13, 72 y.o., female Today's Date: 09/27/2022  END OF SESSION:  PT End of Session - 09/27/22 1016     Visit Number 4    Number of Visits 16    Date for PT Re-Evaluation 11/09/22    PT Start Time 1016    PT Stop Time 1100    PT Time Calculation (min) 44 min    Activity Tolerance Patient tolerated treatment well    Behavior During Therapy [facility] for tasks assessed/performed              Past Medical History:  Diagnosis Date   Fibrocystic breast    Thyroid disease    Past Surgical History:  Procedure Laterality Date   BREAST BIOPSY     Patient Active Problem List   Diagnosis Date Noted   Fracture of patella, right, closed 08/23/2022   Well adult exam 01/19/2021   Osteoporosis 07/21/2020   Benign essential tremor 01/23/2020   Diverticula of colon 05/18/2017   Atrial septal aneurysm 05/16/2016   Back pain 04/06/2016   HLD (hyperlipidemia) 04/06/2016   Hypothyroidism 04/06/2016    PCP: Dr Luetta Nutting  REFERRING PROVIDER: Dr Aundria Mems   REFERRING DIAG: closed fx Rt patella   THERAPY DIAG:  Acute pain of right knee  Stiffness of right knee, not elsewhere classified  Muscle weakness (generalized)  Other abnormalities of gait and mobility  Rationale for Evaluation and Treatment: Rehabilitation  ONSET DATE: 08/04/22  SUBJECTIVE:   SUBJECTIVE STATEMENT: Patient reports she drove herself to therapy today, states she has very mild pain - describes it more as "something's not right". Patient states she has only been elevating leg due to mild swelling in ankle but no ice.  PERTINENT HISTORY: Unremarkable per pt report   PAIN:  Are you having pain? Yes: NPRS scale: 0/10 Pain location: Rt knee  Pain description: ache Aggravating factors: bending knee  Relieving factors: rest   PRECAUTIONS: None  WEIGHT BEARING RESTRICTIONS: No  FALLS:  Has patient  fallen in last 6 months? Yes. 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 Darene Lamer  OBJECTIVE:   DIAGNOSTIC FINDINGS: xray 09/13/22: On the provided frontal and lateral views, the original fracture coronal lucency overlying the patella on frontal view is less well visualized, consistent with at least partial healing sclerosis.  EDEMA:  mild edema knee to ankle   MUSCLE LENGTH: Hamstrings: Right 80 deg; Left 90 deg  POSTURE: rounded shoulders, forward head, and weight shift left  PALPATION: Tender distal quad and peripatellar area Rt LE   LOWER EXTREMITY ROM:  Assessed in supine - hip and ankle ROM WFL's bilat  Active ROM Right eval Left eval Right   Hip flexion     Hip extension     Hip abduction     Hip adduction     Hip internal rotation     Hip external rotation     Knee flexion 72 135 118  Knee extension -5 0 -5  Ankle dorsiflexion     Ankle plantarflexion     Ankle inversion     Ankle eversion      (Blank rows = not tested)  LOWER EXTREMITY MMT:   Rt LE not tested resistively - Lt LE WFL's   MMT Right eval Left eval  Hip flexion    Hip extension    Hip abduction    Hip adduction    Hip internal rotation    Hip external rotation    Knee flexion    Knee extension    Ankle dorsiflexion    Ankle plantarflexion    Ankle inversion    Ankle eversion     (Blank rows = not tested)  GAIT: Distance walked: 50 Assistive device utilized: Crutches Level of assistance: Complete  Independence Comments: WBAT Rt LE good gait pattern with crutches    [facility] Adult PT Treatment:                                                DATE: 09/29/2022 Therapeutic Exercise: Recumbent bike L5 x 98min     [facility] Adult PT Treatment:                                                DATE: 09/27/2022 Therapeutic Exercise: Recumbent bike L3 x 41min Quad set 10x3" SAQ R 10x3" Prone HS curl --> standing HS curl Standing hip abd & ext RTB 2x10 Fwd/bkwd amb 1L each Standing squats RTB above knees x10 S/L clamshells RTB 2x10 S/L straight leg hip abd 2x10 Bent knee fall out RTB x15B Bridges + hip abd RTB x10 Small ROM SLR: parallel, ER, IR x10 each Standing TKE into ball 2x10x5" 4" side step ups x10 --> 6" step ups x15 6" side step ups x15 R&L Mini squats x10 Prone hip extension x10 B Leg press DL 65# 2x10   PATIENT EDUCATION:  Education details: Progress HEP  Person educated: Patient Education method: Explanation, Demonstration, Tactile cues, Verbal cues, and Handouts Education comprehension: verbalized understanding, returned demonstration, verbal cues required, tactile cues required, and needs further education  HOME EXERCISE PROGRAM: Access Code: [email or web address] URL: [email or web address] Date: 09/27/2022 Prepared by: Helane Gunther  Exercises - Supine Heel Slide  - 2 x daily - 7 x weekly - 1 sets - 5-10 reps - 10 sec  hold - Supine Quad Set  - 2 x daily - 7 x weekly - 1 sets - 10 reps - 5 sec  hold - Small Range Straight Leg Raise  - 2 x daily - 7 x weekly - 1 sets - 10 reps - 5 sec  hold - Ankle Pumps in Elevation  - 2 x daily - 7 x weekly - 1 sets - 10-20 reps - Sidelying Hip Abduction  - 1 x daily - 7 x weekly - 3 sets - 10 reps - 3-5 sec  hold - Prone Gluteal Sets  - 2 x daily - 7 x weekly - 1 sets - 10 reps - 10 sec  hold - Prone Quadriceps Stretch with Strap  - 2 x daily - 7 x weekly - 1 sets - 5-10 reps - 10 sec  hold - Seated Knee Flexion  Slide  - 2 x daily -  7 x weekly - 1 sets - 5-10 reps - 10 sec  hold - Small Range Straight Leg Raise  - 1 x daily - 7 x weekly - 3 sets - 10 reps - Standing Hip Extension Kicks  - 1 x daily - 7 x weekly - 3 sets - 10 reps - Standing Hip Abduction Kicks  - 1 x daily - 7 x weekly - 3 sets - 10 reps - Mini Squat with Counter Support  - 1 x daily - 7 x weekly - 3 sets - 10 reps  ASSESSMENT:  CLINICAL IMPRESSION: R LE offloading demonstrated during standing squats; cueing smaller range of motion improved equal weight bearing between bilateral legs. Dynamic single leg balance progressed to higher step and decreased hand hold assist. Initial mild postural sway exhibited during backwards walking, however stability improved over time.    OBJECTIVE IMPAIRMENTS: Abnormal gait; dependent gait; decreased AROM and strength Rt LE; edema Rt LE; decresaed activity tolerance    GOALS: Goals reviewed with patient? Yes  SHORT TERM GOALS: Target date: 10/12/2022   Independent in initial HEP  Baseline: Goal status: INITIAL  2.  Progress to least assistive device for safe ambulation  Baseline:  Goal status: INITIAL  LONG TERM GOALS: Target date: 11/09/2022   Increase AROM Rt knee flexion to 130 degrees and extension to 0  Baseline:  Goal status: INITIAL  2.  5/5 strength Rt LE  Baseline:  Goal status: INITIAL  3.  Independent in ambulation for community distances without assistive device Baseline:  Goal status: INITIAL  4.  Independent in HEP including qauatic program as indicated  Baseline:  Goal status: INITIAL    PLAN:  PT FREQUENCY: 2x/week  PT DURATION: 8 weeks  PLANNED INTERVENTIONS: Therapeutic exercises, Therapeutic activity, Neuromuscular re-education, Balance training, Gait training, Patient/Family education, Self Care, Joint mobilization, Stair training, Aquatic Therapy, Dry Needling, Electrical stimulation, Cryotherapy, Moist heat, Taping, Vasopneumatic device, Ultrasound, Ionotophoresis 4mg /ml  Dexamethasone, Manual therapy, and Re-evaluation  PLAN FOR NEXT SESSION: Recumbent bike warm up; Progress exercises to address ROM, strength, balance.  Hardin Negus, PTA 09/27/2022, 11:06 AM

## 2022-10-04 ENCOUNTER — Ambulatory Visit: Payer: Medicare HMO

## 2022-10-04 DIAGNOSIS — R2689 Other abnormalities of gait and mobility: Secondary | ICD-10-CM

## 2022-10-04 DIAGNOSIS — M6281 Muscle weakness (generalized): Secondary | ICD-10-CM

## 2022-10-04 DIAGNOSIS — S82091D Other fracture of right patella, subsequent encounter for closed fracture with routine healing: Secondary | ICD-10-CM | POA: Diagnosis not present

## 2022-10-04 DIAGNOSIS — X58XXXD Exposure to other specified factors, subsequent encounter: Secondary | ICD-10-CM | POA: Diagnosis not present

## 2022-10-04 DIAGNOSIS — M25561 Pain in right knee: Secondary | ICD-10-CM | POA: Diagnosis not present

## 2022-10-04 DIAGNOSIS — M25661 Stiffness of right knee, not elsewhere classified: Secondary | ICD-10-CM | POA: Diagnosis not present

## 2022-10-04 NOTE — Therapy (Signed)
OUTPATIENT PHYSICAL THERAPY TREATMENT   Patient Name: Melanie Wilkinson MRN: [id number] DOB:May 18, 1951, 72 y.o., female Today's Date: 10/04/2022  END OF SESSION:  PT End of Session - 10/04/22 0931     Visit Number 6    Number of Visits 16    Date for PT Re-Evaluation 11/09/22    Progress Note Due on Visit 10    PT Start Time 0931    PT Stop Time 1014    PT Time Calculation (min) 43 min    Activity Tolerance Patient tolerated treatment well    Behavior During Therapy [facility] for tasks assessed/performed              Past Medical History:  Diagnosis Date   Fibrocystic breast    Thyroid disease    Past Surgical History:  Procedure Laterality Date   BREAST BIOPSY     Patient Active Problem List   Diagnosis Date Noted   Fracture of patella, right, closed 08/23/2022   Well adult exam 01/19/2021   Osteoporosis 07/21/2020   Benign essential tremor 01/23/2020   Diverticula of colon 05/18/2017   Atrial septal aneurysm 05/16/2016   Back pain 04/06/2016   HLD (hyperlipidemia) 04/06/2016   Hypothyroidism 04/06/2016    PCP: Dr Luetta Nutting  REFERRING PROVIDER: Dr Aundria Mems   REFERRING DIAG: closed fx Rt patella   THERAPY DIAG:  Acute pain of right knee  Stiffness of right knee, not elsewhere classified  Muscle weakness (generalized)  Other abnormalities of gait and mobility  Rationale for Evaluation and Treatment: Rehabilitation  ONSET DATE: 08/04/22  SUBJECTIVE:   SUBJECTIVE STATEMENT: Patient reports her R ankle is bothering her today, states she has been walking on sloped sidewalks with her dog.  PERTINENT HISTORY: Unremarkable per pt report   PAIN:  Are you having pain? Yes: NPRS scale: 0/10 Pain location: Rt knee  Pain description: ache Aggravating factors: bending knee  Relieving factors: rest   PRECAUTIONS: None  WEIGHT BEARING RESTRICTIONS: No  FALLS:  Has patient fallen in last 6 months? Yes. Number of falls 1  LIVING  ENVIRONMENT: Lives with: lives with their spouse Lives in: House/apartment Stairs: Yes: Internal: 14 steps; can reach both and External: 3 steps; can reach both Has following equipment at home: [place]: retired from [employer] 34 years retired 6 yr ago    Household chores; bodyblade work out 5x/wk; Firefighter; crocheting; walking 2-3 miles per day walking dog   PLOF: Independent  PATIENT GOALS: walk without crutches and bend knee without pain   NEXT MD VISIT: 10/25/22 with Dr. Darene Lamer  OBJECTIVE:   DIAGNOSTIC FINDINGS: xray 09/13/22: On the provided frontal and lateral views, the original fracture coronal lucency overlying the patella on frontal view is less well visualized, consistent with at least partial healing sclerosis.  EDEMA:  mild edema knee to ankle   MUSCLE LENGTH: Hamstrings: Right 80 deg; Left 90 deg  POSTURE: rounded shoulders, forward head, and weight shift left  PALPATION: Tender distal quad and peripatellar area Rt LE   LOWER EXTREMITY ROM:  Assessed in supine - hip and ankle ROM WFL's bilat  Active ROM Right eval Left eval Right   Hip flexion     Hip extension     Hip abduction     Hip adduction     Hip internal rotation     Hip external rotation     Knee flexion 72 135 118  Knee extension -5 0 -5  Ankle dorsiflexion  Ankle plantarflexion     Ankle inversion     Ankle eversion      (Blank rows = not tested)  LOWER EXTREMITY MMT:   Rt LE not tested resistively - Lt LE WFL's   MMT Right eval Left eval  Hip flexion    Hip extension    Hip abduction    Hip adduction    Hip internal rotation    Hip external rotation    Knee flexion    Knee extension    Ankle dorsiflexion    Ankle plantarflexion    Ankle inversion    Ankle eversion     (Blank rows = not tested)  GAIT: Distance walked: 50 Assistive device utilized: Crutches Level of assistance: Complete Independence Comments: WBAT Rt LE good gait pattern with crutches     [facility] Adult PT Treatment:                                                DATE: 10/04/2022 Therapeutic Exercise: Recumbent bike warm-up L3 x 5 min Supine: Small ROM SLR: parallel, ER, IR x10 each Bent knee fall out GTB 2x15 B S/L bent knee hip abd GTB 2x10 S/L straight leg hip abd + IR GTB 2x10 Seated knee extension GTB 2x12 R Leg press: DL 90# 2x10 --> R SL 45# x10, 40# x5 R thomas stretch 2x30" Woodpecker (R) 2x10 R TKE w/ball 2x10x5" STS cradling 10#KB 2x10     [facility] Adult PT Treatment:                                                DATE: 09/29/2022 Therapeutic Exercise: Recumbent bike L5 x 20min Prone TKE/quad set R x10 S/L clamshells RTB x10, GTB x10 S/L bent knee hip abd GTB x15 Bent knee fall out GTB 2x10 B STS GTB above knees x10 Seated knee extension GTB 2x10 R Standing mini squat with GTB above knees x10 Standing R TKE GTB x10 Bent knee side stepping GTB above knees (counter) 4L   PATIENT EDUCATION:  Education details: Desensitization techniques Person educated: Patient Education method: Explanation, Demonstration, Tactile cues, Verbal cues, and Handouts Education comprehension: verbalized understanding, returned demonstration, verbal cues required, tactile cues required, and needs further education  HOME EXERCISE PROGRAM: Access Code: [email or web address] URL: [email or web address] Date: 09/29/2022 Prepared by: Helane Gunther  Exercises - Supine Heel Slide  - 2 x daily - 7 x weekly - 1 sets - 5-10 reps - 10 sec  hold - Supine Quad Set  - 2 x daily - 7 x weekly - 1 sets - 10 reps - 5 sec  hold - Small Range Straight Leg Raise  - 2 x daily - 7 x weekly - 1 sets - 10 reps - 5 sec  hold - Ankle Pumps in Elevation  - 2 x daily - 7 x weekly - 1 sets - 10-20 reps - Sidelying Hip Abduction  - 1 x daily - 7 x weekly - 3 sets - 10 reps - 3-5 sec  hold - Prone Gluteal Sets  - 2 x daily - 7 x weekly - 1 sets - 10 reps - 10 sec  hold - Prone Quadriceps Stretch with  Strap  - 2 x daily -  7 x weekly - 1 sets - 5-10 reps - 10 sec  hold - Seated Knee Flexion Slide  - 2 x daily - 7 x weekly - 1 sets - 5-10 reps - 10 sec  hold - Small Range Straight Leg Raise  - 1 x daily - 7 x weekly - 3 sets - 10 reps - Standing Hip Extension Kicks  - 1 x daily - 7 x weekly - 3 sets - 10 reps - Standing Hip Abduction Kicks  - 1 x daily - 7 x weekly - 3 sets - 10 reps - Mini Squat with Counter Support  - 1 x daily - 7 x weekly - 3 sets - 10 reps - Clam with Resistance  - 1 x daily - 7 x weekly - 3 sets - 10 reps - Bridge with Hip Abduction and Resistance - Ground Touches  - 1 x daily - 7 x weekly - 3 sets - 10 reps - Seated Knee Extension with Resistance  - 1 x daily - 7 x weekly - 3 sets - 10 reps - Standing Terminal Knee Extension with Resistance  - 1 x daily - 7 x weekly - 3 sets - 10 reps - Sidestepping in Squat with Resistance and Arms Forward  - 1 x daily - 7 x weekly - 3 sets - 10 reps  ASSESSMENT:  CLINICAL IMPRESSION: Weight increased on leg press to progress quad strengthening; patient demonstrated improved total knee extension with no knee hyperextension. Patient demonstrated improved knee stability during resisted sit to stand exercise.   OBJECTIVE IMPAIRMENTS: Abnormal gait; dependent gait; decreased AROM and strength Rt LE; edema Rt LE; decresaed activity tolerance    GOALS: Goals reviewed with patient? Yes  SHORT TERM GOALS: Target date: 10/12/2022   Independent in initial HEP  Baseline: Goal status: INITIAL  2.  Progress to least assistive device for safe ambulation  Baseline:  Goal status: INITIAL  LONG TERM GOALS: Target date: 11/09/2022   Increase AROM Rt knee flexion to 130 degrees and extension to 0  Baseline:  Goal status: INITIAL  2.  5/5 strength Rt LE  Baseline:  Goal status: INITIAL  3.  Independent in ambulation for community distances without assistive device Baseline:  Goal status: INITIAL  4.  Independent in HEP including  qauatic program as indicated  Baseline:  Goal status: INITIAL    PLAN:  PT FREQUENCY: 2x/week  PT DURATION: 8 weeks  PLANNED INTERVENTIONS: Therapeutic exercises, Therapeutic activity, Neuromuscular re-education, Balance training, Gait training, Patient/Family education, Self Care, Joint mobilization, Stair training, Aquatic Therapy, Dry Needling, Electrical stimulation, Cryotherapy, Moist heat, Taping, Vasopneumatic device, Ultrasound, Ionotophoresis 4mg /ml Dexamethasone, Manual therapy, and Re-evaluation  PLAN FOR NEXT SESSION: Progress exercises to address ROM, strength, balance.  Hardin Negus, PTA 10/04/2022, 10:15 AM

## 2022-10-06 ENCOUNTER — Ambulatory Visit: Payer: Medicare HMO | Attending: Sports Medicine

## 2022-10-06 DIAGNOSIS — M6281 Muscle weakness (generalized): Secondary | ICD-10-CM | POA: Diagnosis present

## 2022-10-06 DIAGNOSIS — M25661 Stiffness of right knee, not elsewhere classified: Secondary | ICD-10-CM | POA: Insufficient documentation

## 2022-10-06 DIAGNOSIS — R2689 Other abnormalities of gait and mobility: Secondary | ICD-10-CM | POA: Diagnosis present

## 2022-10-06 DIAGNOSIS — M25561 Pain in right knee: Secondary | ICD-10-CM | POA: Insufficient documentation

## 2022-10-06 NOTE — Therapy (Signed)
OUTPATIENT PHYSICAL THERAPY TREATMENT   Patient Name: Melanie Wilkinson MRN: [id number] DOB:[birth year], 72 y.o., female Today's Date: 10/06/2022  END OF SESSION:  PT End of Session - 10/06/22 0807     Visit Number 7    Number of Visits 16    Date for PT Re-Evaluation 11/09/22    Progress Note Due on Visit 10    PT Start Time 0805    PT Stop Time 0845    PT Time Calculation (min) 40 min              Past Medical History:  Diagnosis Date   Fibrocystic breast    Thyroid disease    Past Surgical History:  Procedure Laterality Date   BREAST BIOPSY     Patient Active Problem List   Diagnosis Date Noted   Fracture of patella, right, closed 08/23/2022   Well adult exam 01/19/2021   Osteoporosis 07/21/2020   Benign essential tremor 01/23/2020   Diverticula of colon 05/18/2017   Atrial septal aneurysm 05/16/2016   Back pain 04/06/2016   HLD (hyperlipidemia) 04/06/2016   Hypothyroidism 04/06/2016    PCP: Dr Luetta Nutting  REFERRING PROVIDER: Dr Aundria Mems   REFERRING DIAG: closed fx Rt patella   THERAPY DIAG:  Acute pain of right knee  Stiffness of right knee, not elsewhere classified  Muscle weakness (generalized)  Other abnormalities of gait and mobility  Rationale for Evaluation and Treatment: Rehabilitation  ONSET DATE: 08/04/22  SUBJECTIVE:   SUBJECTIVE STATEMENT: Patient reports her knee is feeling better however R ankle and top of foot continues to bother her, describes it as a "tightness".  PERTINENT HISTORY: Unremarkable per pt report   PAIN:  Are you having pain? Yes: NPRS scale: 0/10 Pain location: Rt knee  Pain description: ache Aggravating factors: bending knee  Relieving factors: rest   PRECAUTIONS: None  WEIGHT BEARING RESTRICTIONS: No  FALLS:  Has patient fallen in last 6 months? Yes. 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 Darene Lamer  OBJECTIVE:   DIAGNOSTIC FINDINGS: xray 09/13/22: On the provided frontal and lateral views, the original fracture coronal lucency overlying the patella on frontal view is less well visualized, consistent with at least partial healing sclerosis.  EDEMA:  mild edema knee to ankle   MUSCLE LENGTH: Hamstrings: Right 80 deg; Left 90 deg  POSTURE: rounded shoulders, forward head, and weight shift left  PALPATION: Tender distal quad and peripatellar area Rt LE   LOWER EXTREMITY ROM:  Assessed in supine - hip and ankle ROM WFL's bilat  Active ROM Right eval Left eval Right   Hip flexion     Hip extension     Hip abduction     Hip adduction     Hip internal rotation     Hip external rotation     Knee flexion 72 135 118  Knee extension -5 0 -5  Ankle dorsiflexion     Ankle plantarflexion     Ankle inversion     Ankle eversion      (  Blank rows = not tested)  LOWER EXTREMITY MMT:   Rt LE not tested resistively - Lt LE WFL's   MMT Right eval Left eval  Hip flexion    Hip extension    Hip abduction    Hip adduction    Hip internal rotation    Hip external rotation    Knee flexion    Knee extension    Ankle dorsiflexion    Ankle plantarflexion    Ankle inversion    Ankle eversion     (Blank rows = not tested)  GAIT: Distance walked: 50 Assistive device utilized: Crutches Level of assistance: Complete Independence Comments: WBAT Rt LE good gait pattern with crutches     [facility] Adult PT Treatment:                                                DATE: 10/06/2022 Therapeutic  Exercise: Recumbent bike warm up L5 x 45min S/L passive hip flexor stetch (femoral nerve glide) Prone quad stretch w/strap 10x5" B Prone femoral nerve glide --> S/L femoral nerve glide Seated anterior tib stretch Seated resisted knee extension (L) RTB x15 Resisted bent knee side stepping RTB 8x10'    [facility] Adult PT Treatment:                                                DATE: 10/04/2022 Therapeutic Exercise: Recumbent bike warm-up L3 x 5 min Supine: Small ROM SLR: parallel, ER, IR x10 each Bent knee fall out GTB 2x15 B S/L bent knee hip abd GTB 2x10 S/L straight leg hip abd + IR GTB 2x10 Seated knee extension GTB 2x12 R Leg press: DL 90# 2x10 --> R SL 45# x10, 40# x5 R thomas stretch 2x30" Woodpecker (R) 2x10 R TKE w/ball 2x10x5" STS cradling 10#KB 2x10    PATIENT EDUCATION:  Education details: Femoral nerve glide Person educated: Patient Education method: Explanation, Demonstration, Tactile cues, Verbal cues, and Handouts Education comprehension: verbalized understanding, returned demonstration, verbal cues required, tactile cues required, and needs further education  HOME EXERCISE PROGRAM: Access Code: [email or web address] URL: [email or web address] Date: 10/06/2022 Prepared by: Helane Gunther  Exercises - Sidelying Hip Abduction  - 1 x daily - 7 x weekly - 3 sets - 10 reps - 3-5 sec  hold - Prone Gluteal Sets  - 2 x daily - 7 x weekly - 1 sets - 10 reps - 10 sec  hold - Prone Quadriceps Stretch with Strap  - 2 x daily - 7 x weekly - 1 sets - 5-10 reps - 10 sec  hold - Small Range Straight Leg Raise  - 1 x daily - 7 x weekly - 3 sets - 10 reps - Standing Hip Extension Kicks  - 1 x daily - 7 x weekly - 3 sets - 10 reps - Standing Hip Abduction Kicks  - 1 x daily - 7 x weekly - 3 sets - 10 reps - Mini Squat with Counter Support  - 1 x daily - 7 x weekly - 3 sets - 10 reps - Clam with Resistance  - 1 x daily - 7 x weekly - 3 sets - 10 reps - Bridge with Hip Abduction  and Resistance - Ground Touches  -  1 x daily - 7 x weekly - 3 sets - 10 reps - Seated Knee Extension with Resistance  - 1 x daily - 7 x weekly - 3 sets - 10 reps - Standing Terminal Knee Extension with Resistance  - 1 x daily - 7 x weekly - 3 sets - 10 reps - Sidestepping in Squat with Resistance and Arms Forward  - 1 x daily - 7 x weekly - 3 sets - 10 reps - Sidelying Femoral Nerve Glide - Top Leg  - 1 x daily - 7 x weekly - 3 sets - 10 reps - Seated Anterior Tibialis Stretch  - 1 x daily - 7 x weekly - 3 sets - 10 reps - Prone Hip Extension  - 1 x daily - 7 x weekly - 3 sets - 10 reps  ASSESSMENT:  CLINICAL IMPRESSION: Passive and active femoral nerve glides performed in side lying and prone to address tightness and subjective symptoms. Anterior tibialis stretch performed in sitting with good response from patient, who reported decreased ankle discomfort afterwards.   OBJECTIVE IMPAIRMENTS: Abnormal gait; dependent gait; decreased AROM and strength Rt LE; edema Rt LE; decresaed activity tolerance    GOALS: Goals reviewed with patient? Yes  SHORT TERM GOALS: Target date: 10/12/2022   Independent in initial HEP  Baseline: Goal status: INITIAL  2.  Progress to least assistive device for safe ambulation  Baseline:  Goal status: INITIAL  LONG TERM GOALS: Target date: 11/09/2022   Increase AROM Rt knee flexion to 130 degrees and extension to 0  Baseline:  Goal status: INITIAL  2.  5/5 strength Rt LE  Baseline:  Goal status: INITIAL  3.  Independent in ambulation for community distances without assistive device Baseline:  Goal status: INITIAL  4.  Independent in HEP including qauatic program as indicated  Baseline:  Goal status: INITIAL    PLAN:  PT FREQUENCY: 2x/week  PT DURATION: 8 weeks  PLANNED INTERVENTIONS: Therapeutic exercises, Therapeutic activity, Neuromuscular re-education, Balance training, Gait training, Patient/Family education, Self Care, Joint  mobilization, Stair training, Aquatic Therapy, Dry Needling, Electrical stimulation, Cryotherapy, Moist heat, Taping, Vasopneumatic device, Ultrasound, Ionotophoresis 4mg /ml Dexamethasone, Manual therapy, and Re-evaluation  PLAN FOR NEXT SESSION: Follow up on nerve glide/anter tib stretch (ankle/foot pain). Progress exercises to address ROM, strength, balance.  Hardin Negus, PTA 10/06/2022, 9:40 AM

## 2022-10-10 ENCOUNTER — Ambulatory Visit: Payer: Medicare HMO

## 2022-10-10 DIAGNOSIS — M25561 Pain in right knee: Secondary | ICD-10-CM | POA: Diagnosis not present

## 2022-10-10 DIAGNOSIS — M6281 Muscle weakness (generalized): Secondary | ICD-10-CM

## 2022-10-10 DIAGNOSIS — R2689 Other abnormalities of gait and mobility: Secondary | ICD-10-CM

## 2022-10-10 DIAGNOSIS — M25661 Stiffness of right knee, not elsewhere classified: Secondary | ICD-10-CM

## 2022-10-10 NOTE — Therapy (Signed)
OUTPATIENT PHYSICAL THERAPY TREATMENT   Patient Name: Melanie Wilkinson MRN: [id number] DOB:1950-09-17, 72 y.o., female Today's Date: 10/10/2022  END OF SESSION:  PT End of Session - 10/10/22 0852     Visit Number 8    Number of Visits 16    Date for PT Re-Evaluation 11/09/22    Progress Note Due on Visit 10    PT Start Time 0850    PT Stop Time 0928    PT Time Calculation (min) 38 min    Activity Tolerance Patient tolerated treatment well    Behavior During Therapy [facility] for tasks assessed/performed              Past Medical History:  Diagnosis Date   Fibrocystic breast    Thyroid disease    Past Surgical History:  Procedure Laterality Date   BREAST BIOPSY     Patient Active Problem List   Diagnosis Date Noted   Fracture of patella, right, closed 08/23/2022   Well adult exam 01/19/2021   Osteoporosis 07/21/2020   Benign essential tremor 01/23/2020   Diverticula of colon 05/18/2017   Atrial septal aneurysm 05/16/2016   Back pain 04/06/2016   HLD (hyperlipidemia) 04/06/2016   Hypothyroidism 04/06/2016    PCP: Dr Luetta Nutting  REFERRING PROVIDER: Dr Aundria Mems   REFERRING DIAG: closed fx Rt patella   THERAPY DIAG:  Acute pain of right knee  Stiffness of right knee, not elsewhere classified  Muscle weakness (generalized)  Other abnormalities of gait and mobility  Rationale for Evaluation and Treatment: Rehabilitation  ONSET DATE: 08/04/22  SUBJECTIVE:   SUBJECTIVE STATEMENT: Patient reports her ankle is feeling better however continues to get "pins and needles" in L thigh. Patient reports no pain in knees today.  PERTINENT HISTORY: Unremarkable per pt report   PAIN:  Are you having pain? Yes: NPRS scale: 0/10 Pain location: Rt knee  Pain description: ache Aggravating factors: bending knee  Relieving factors: rest   PRECAUTIONS: None  WEIGHT BEARING RESTRICTIONS: No  FALLS:  Has patient fallen in last 6 months? Yes. Number of falls  1  LIVING ENVIRONMENT: Lives with: lives with their spouse Lives in: House/apartment Stairs: Yes: Internal: 14 steps; can reach both and External: 3 steps; can reach both Has following equipment at home: [place]: retired from [employer] 34 years retired 6 yr ago    Household chores; bodyblade work out 5x/wk; Firefighter; crocheting; walking 2-3 miles per day walking dog   PLOF: Independent  PATIENT GOALS: walk without crutches and bend knee without pain   NEXT MD VISIT: 10/25/22 with Dr. Darene Lamer  OBJECTIVE:   DIAGNOSTIC FINDINGS: xray 09/13/22: On the provided frontal and lateral views, the original fracture coronal lucency overlying the patella on frontal view is less well visualized, consistent with at least partial healing sclerosis.  EDEMA:  mild edema knee to ankle   MUSCLE LENGTH: Hamstrings: Right 80 deg; Left 90 deg  POSTURE: rounded shoulders, forward head, and weight shift left  PALPATION: Tender distal quad and peripatellar area Rt LE   LOWER EXTREMITY ROM:  Assessed in supine - hip and ankle ROM WFL's bilat  Active ROM Right eval Left eval Right   Hip flexion     Hip extension     Hip abduction     Hip adduction     Hip internal rotation     Hip external rotation     Knee flexion 72 135 118  Knee extension -5 0 -5  Ankle dorsiflexion     Ankle plantarflexion     Ankle inversion     Ankle eversion      (Blank rows = not tested)  LOWER EXTREMITY MMT:   Rt LE not tested resistively - Lt LE WFL's   MMT Right eval Left eval  Hip flexion    Hip extension    Hip abduction    Hip adduction    Hip internal rotation    Hip external rotation    Knee flexion    Knee extension    Ankle dorsiflexion    Ankle plantarflexion    Ankle inversion    Ankle eversion     (Blank rows = not tested)  GAIT: Distance walked: 50 Assistive device utilized: Crutches Level of assistance: Complete Independence Comments: WBAT Rt LE good gait pattern with  crutches     [facility] Adult PT Treatment:                                                DATE: 10/10/2021 Therapeutic Exercise: Recumbent bike L3 x12min Prone quad stretch w/strap  3x30" B S/L femoral glide/hip flexor stretch (L) S/L straight leg hip abd raises (IR) 2x10 Prone HS curls YTB 2x10 B Prone hip extension x12 B Staggered stance bridges x10 B Supine piriformis (foot crossed over straight leg/midline) 2x30" Bent knee side stepping GTB x3L Leg press: DL 70# 2x15, R SL 40# x10   [facility] Adult PT Treatment:                                                DATE: 09/27/2022 Therapeutic Exercise: Recumbent bike L3 x 21min Quad set 10x3" SAQ R 10x3" Prone HS curl --> standing HS curl Standing hip abd & ext RTB 2x10 Fwd/bkwd amb 1L each Standing squats RTB above knees x10 S/L clamshells RTB 2x10 S/L straight leg hip abd 2x10 Bent knee fall out RTB x15B Bridges + hip abd RTB x10 Small ROM SLR: parallel, ER, IR x10 each Standing TKE into ball 2x10x5" 4" side step ups x10 --> 6" step ups x15 6" side step ups x15 R&L Mini squats x10 Prone hip extension x10 B Leg press DL 65# 2x10   PATIENT EDUCATION:  Education details: Progressing HEP with bands; piriformis stretches Person educated: Patient Education method: Explanation, Demonstration, Tactile cues, Verbal cues, and Handouts Education comprehension: verbalized understanding, returned demonstration, verbal cues required, tactile cues required, and needs further education  HOME EXERCISE PROGRAM: Access Code: [email or web address] URL: [email or web address] Date: 10/10/2022 Prepared by: Helane Gunther  Exercises - Sidelying Hip Abduction  - 1 x daily - 7 x weekly - 3 sets - 10 reps - 3-5 sec  hold - Prone Gluteal Sets  - 2 x daily - 7 x weekly - 1 sets - 10 reps - 10 sec  hold - Prone Quadriceps Stretch with Strap  - 2 x daily - 7 x weekly - 1 sets - 5-10 reps - 10 sec  hold - Small Range Straight Leg Raise  - 1 x daily - 7 x  weekly - 3 sets - 10 reps - Standing Hip Extension Kicks  - 1 x daily - 7 x weekly - 3 sets - 10 reps -  Standing Hip Abduction Kicks  - 1 x daily - 7 x weekly - 3 sets - 10 reps - Mini Squat with Counter Support  - 1 x daily - 7 x weekly - 3 sets - 10 reps - Clam with Resistance  - 1 x daily - 7 x weekly - 3 sets - 10 reps - Bridge with Hip Abduction and Resistance - Ground Touches  - 1 x daily - 7 x weekly - 3 sets - 10 reps - Seated Knee Extension with Resistance  - 1 x daily - 7 x weekly - 3 sets - 10 reps - Standing Terminal Knee Extension with Resistance  - 1 x daily - 7 x weekly - 3 sets - 10 reps - Sidestepping in Squat with Resistance and Arms Forward  - 1 x daily - 7 x weekly - 3 sets - 10 reps - Sidelying Femoral Nerve Glide - Top Leg  - 1 x daily - 7 x weekly - 3 sets - 10 reps - Seated Anterior Tibialis Stretch  - 1 x daily - 7 x weekly - 3 sets - 10 reps - Prone Hip Extension  - 1 x daily - 7 x weekly - 3 sets - 10 reps - Supine Piriformis Stretch with Leg Straight  - 2 x daily - 7 x weekly - 1 sets - 5 reps - 10-20 hold - Seated Piriformis Stretch with Trunk Bend  - 2 x daily - 7 x weekly - 1 sets - 5 reps - 10-20 hold  ASSESSMENT:  CLINICAL IMPRESSION: Tactile cues provided to improve pelvic stability during prone hamstring curls and quad stretches. Slight decrease in weight needed during R single leg press due to fatigue. Piriformis stretches performed to address tightness and tension (L>R).   OBJECTIVE IMPAIRMENTS: Abnormal gait; dependent gait; decreased AROM and strength Rt LE; edema Rt LE; decresaed activity tolerance    GOALS: Goals reviewed with patient? Yes  SHORT TERM GOALS: Target date: 10/12/2022   Independent in initial HEP  Baseline: Goal status: MET 10/10/22  2.  Progress to least assistive device for safe ambulation  Baseline:  Goal status: MET 10/10/22   LONG TERM GOALS: Target date: 11/09/2022   Increase AROM Rt knee flexion to 130 degrees and  extension to 0  Baseline:  Goal status: INITIAL  2.  5/5 strength Rt LE  Baseline:  Goal status: INITIAL  3.  Independent in ambulation for community distances without assistive device Baseline:  Goal status: INITIAL  4.  Independent in HEP including qauatic program as indicated  Baseline:  Goal status: INITIAL    PLAN:  PT FREQUENCY: 2x/week  PT DURATION: 8 weeks  PLANNED INTERVENTIONS: Therapeutic exercises, Therapeutic activity, Neuromuscular re-education, Balance training, Gait training, Patient/Family education, Self Care, Joint mobilization, Stair training, Aquatic Therapy, Dry Needling, Electrical stimulation, Cryotherapy, Moist heat, Taping, Vasopneumatic device, Ultrasound, Ionotophoresis 4mg /ml Dexamethasone, Manual therapy, and Re-evaluation  PLAN FOR NEXT SESSION: Recumbent bike warm up; Progress exercises to address ROM, strength, balance.  Hardin Negus, PTA 10/10/2022, 9:28 AM

## 2022-10-13 ENCOUNTER — Ambulatory Visit: Payer: Medicare HMO

## 2022-10-13 DIAGNOSIS — R2689 Other abnormalities of gait and mobility: Secondary | ICD-10-CM

## 2022-10-13 DIAGNOSIS — M25561 Pain in right knee: Secondary | ICD-10-CM

## 2022-10-13 DIAGNOSIS — M25661 Stiffness of right knee, not elsewhere classified: Secondary | ICD-10-CM | POA: Diagnosis not present

## 2022-10-13 DIAGNOSIS — M6281 Muscle weakness (generalized): Secondary | ICD-10-CM

## 2022-10-13 NOTE — Therapy (Signed)
OUTPATIENT PHYSICAL THERAPY TREATMENT   Patient Name: Melanie Wilkinson MRN: [id number] DOB:Apr 26, 1951, 72 y.o., female Today's Date: 10/13/2022  END OF SESSION:  PT End of Session - 10/13/22 0930     Visit Number 9    Number of Visits 16    Date for PT Re-Evaluation 11/09/22    Progress Note Due on Visit 10    PT Start Time 0930    PT Stop Time 1012    PT Time Calculation (min) 42 min    Activity Tolerance Patient tolerated treatment well    Behavior During Therapy [facility] for tasks assessed/performed              Past Medical History:  Diagnosis Date   Fibrocystic breast    Thyroid disease    Past Surgical History:  Procedure Laterality Date   BREAST BIOPSY     Patient Active Problem List   Diagnosis Date Noted   Fracture of patella, right, closed 08/23/2022   Well adult exam 01/19/2021   Osteoporosis 07/21/2020   Benign essential tremor 01/23/2020   Diverticula of colon 05/18/2017   Atrial septal aneurysm 05/16/2016   Back pain 04/06/2016   HLD (hyperlipidemia) 04/06/2016   Hypothyroidism 04/06/2016    PCP: Dr Luetta Nutting  REFERRING PROVIDER: Dr Aundria Mems   REFERRING DIAG: closed fx Rt patella   THERAPY DIAG:  Acute pain of right knee  Stiffness of right knee, not elsewhere classified  Muscle weakness (generalized)  Other abnormalities of gait and mobility  Rationale for Evaluation and Treatment: Rehabilitation  ONSET DATE: 08/04/22  SUBJECTIVE:   SUBJECTIVE STATEMENT: Patient reports her previous chiropractor gave her piriformis stretches and they have been helping the tingling in her L thigh.  PERTINENT HISTORY: Unremarkable per pt report   PAIN:  Are you having pain? Yes: NPRS scale: 0/10 Pain location: Rt knee  Pain description: ache Aggravating factors: bending knee  Relieving factors: rest   PRECAUTIONS: None  WEIGHT BEARING RESTRICTIONS: No  FALLS:  Has patient fallen in last 6 months? Yes. 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 Darene Lamer  OBJECTIVE:   DIAGNOSTIC FINDINGS: xray 09/13/22: On the provided frontal and lateral views, the original fracture coronal lucency overlying the patella on frontal view is less well visualized, consistent with at least partial healing sclerosis.  EDEMA:  mild edema knee to ankle   MUSCLE LENGTH: Hamstrings: Right 80 deg; Left 90 deg  POSTURE: rounded shoulders, forward head, and weight shift left  PALPATION: Tender distal quad and peripatellar area Rt LE   LOWER EXTREMITY ROM:  Assessed in supine - hip and ankle ROM WFL's bilat  Active ROM Right eval Left eval Right   Hip flexion     Hip extension     Hip abduction     Hip adduction     Hip internal rotation     Hip external rotation     Knee flexion 72 135 118  Knee extension -5 0 -5  Ankle dorsiflexion  Ankle plantarflexion     Ankle inversion     Ankle eversion      (Blank rows = not tested)  LOWER EXTREMITY MMT:   Rt LE not tested resistively - Lt LE WFL's   MMT Right eval Left eval  Hip flexion    Hip extension    Hip abduction    Hip adduction    Hip internal rotation    Hip external rotation    Knee flexion    Knee extension    Ankle dorsiflexion    Ankle plantarflexion    Ankle inversion    Ankle eversion     (Blank rows = not tested)  GAIT: Distance walked: 50 Assistive device utilized: Crutches Level of assistance: Complete Independence Comments: WBAT Rt LE good gait pattern with crutches     [facility] Adult PT Treatment:                                                DATE: 10/13/2022 Therapeutic Exercise: Recumbent bike L3 x56min Seated figure stretch (alt ER/IR) 5x10" each Self myofascial release/TPR piriformis with tennis ball S/L clamshells GTB 2x10 (L) Prone bent knee hip extension x10 B Modified side plank --> added hip dips 6" step up with high knee --> lateral heel taps STS x10 --> squat with GTB above knees + seat tap on pillow x10 R SL leg press 45# 2x15    [facility] Adult PT Treatment:                                                DATE: 10/10/2021 Therapeutic Exercise: Recumbent bike L3 x38min Prone quad stretch w/strap  3x30" B S/L femoral glide/hip flexor stretch (L) S/L straight leg hip abd raises (IR) 2x10 Prone HS curls YTB 2x10 B Prone hip extension x12 B Staggered stance bridges x10 B Supine piriformis (foot crossed over straight leg/midline) 2x30" Bent knee side stepping GTB x3L Leg press: DL 70# 2x15, R SL 40# x10   PATIENT EDUCATION:  Education details: Progressing HEP with bands; piriformis stretches Person educated: Patient Education method: Explanation, Demonstration, Tactile cues, Verbal cues, and Handouts Education comprehension: verbalized understanding, returned demonstration, verbal cues required, tactile cues required, and needs further education  HOME EXERCISE PROGRAM: Access Code: [email or web address] URL: [email or web address] Date: 10/13/2022 Prepared by: Helane Gunther  Exercises - Prone Quadriceps Stretch with Strap  - 2 x daily - 7 x weekly - 1 sets - 5-10 reps - 10 sec  hold - Small Range Straight Leg Raise  - 1 x daily - 7 x weekly - 3 sets - 10 reps - Standing Hip Extension Kicks  - 1 x daily - 7 x weekly - 3 sets - 10 reps - Standing Hip Abduction Kicks  - 1 x daily - 7 x weekly - 3 sets - 10 reps - Mini Squat with Counter Support  - 1 x daily - 7 x weekly - 3 sets - 10 reps - Clam with Resistance  - 1 x daily - 7 x weekly - 3  sets - 10 reps - Bridge with Hip Abduction and Resistance - Ground Touches  - 1 x daily - 7 x weekly - 3 sets - 10 reps - Seated  Knee Extension with Resistance  - 1 x daily - 7 x weekly - 3 sets - 10 reps - Standing Terminal Knee Extension with Resistance  - 1 x daily - 7 x weekly - 3 sets - 10 reps - Sidestepping in Squat with Resistance and Arms Forward  - 1 x daily - 7 x weekly - 3 sets - 10 reps - Prone Hip Extension  - 1 x daily - 7 x weekly - 3 sets - 10 reps - Seated Piriformis Stretch with Trunk Bend  - 2 x daily - 7 x weekly - 1 sets - 5 reps - 10-20 hold - Seated Piriformis Stretch  - 1 x daily - 7 x weekly - 3 sets - 10 reps - Supine Figure 4 Piriformis Stretch  - 1 x daily - 7 x weekly - 3 sets - 10 reps - Prone Hip Extension with Bent Knee  - 1 x daily - 7 x weekly - 3 sets - 10 reps - Side Plank on Knees  - 1 x daily - 7 x weekly - 3 sets - 10 reps  ASSESSMENT:  CLINICAL IMPRESSION: Piriformis stretch variations performed to address subjective symptoms. Patient continues to offload R LE during standing squats; form improved with tactile and verbal cues. Tactile cues provided to improve pelvic stability and decrease off-loading of R LE during lateral heel taps on step.  OBJECTIVE IMPAIRMENTS: Abnormal gait; dependent gait; decreased AROM and strength Rt LE; edema Rt LE; decresaed activity tolerance    GOALS: Goals reviewed with patient? Yes  SHORT TERM GOALS: Target date: 10/12/2022   Independent in initial HEP  Baseline: Goal status: MET 10/10/22  2.  Progress to least assistive device for safe ambulation  Baseline:  Goal status: MET 10/10/22   LONG TERM GOALS: Target date: 11/09/2022   Increase AROM Rt knee flexion to 130 degrees and extension to 0  Baseline:  Goal status: INITIAL  2.  5/5 strength Rt LE  Baseline:  Goal status: INITIAL  3.  Independent in ambulation for community distances without assistive device Baseline:  Goal status: INITIAL  4.   Independent in HEP including qauatic program as indicated  Baseline:  Goal status: INITIAL    PLAN:  PT FREQUENCY: 2x/week  PT DURATION: 8 weeks  PLANNED INTERVENTIONS: Therapeutic exercises, Therapeutic activity, Neuromuscular re-education, Balance training, Gait training, Patient/Family education, Self Care, Joint mobilization, Stair training, Aquatic Therapy, Dry Needling, Electrical stimulation, Cryotherapy, Moist heat, Taping, Vasopneumatic device, Ultrasound, Ionotophoresis 4mg /ml Dexamethasone, Manual therapy, and Re-evaluation  PLAN FOR NEXT SESSION: Review new HEP as needed. Progress exercises to address ROM, strength, balance.  Hardin Negus, PTA 10/13/2022, 10:12 AM

## 2022-10-14 ENCOUNTER — Telehealth: Payer: Medicare HMO | Admitting: Nurse Practitioner

## 2022-10-14 DIAGNOSIS — U071 COVID-19: Secondary | ICD-10-CM

## 2022-10-14 MED ORDER — NIRMATRELVIR/RITONAVIR (PAXLOVID)TABLET: 3.0000 | ORAL_TABLET | Freq: Two times a day (BID) | ORAL | 0 refills | Status: DC

## 2022-10-14 NOTE — Progress Notes (Signed)
Virtual Visit Consent   Melanie Wilkinson, you are scheduled for a virtual visit with a [facility] provider today. Just as with appointments in the office, your consent must be obtained to participate. Your consent will be active for this visit and any virtual visit you may have with one of our providers in the next 365 days. If you have a MyChart account, a copy of this consent can be sent to you electronically.  As this is a virtual visit, video technology does not allow for your provider to perform a traditional examination. This may limit your provider's ability to fully assess your condition. If your provider identifies any concerns that need to be evaluated in person or the need to arrange testing (such as labs, EKG, etc.), we will make arrangements to do so. Although advances in technology are sophisticated, we cannot ensure that it will always work on either your end or our end. If the connection with a video visit is poor, the visit may have to be switched to a telephone visit. With either a video or telephone visit, we are not always able to ensure that we have a secure connection.  By engaging in this virtual visit, you consent to the provision of healthcare and authorize for your insurance to be billed (if applicable) for the services provided during this visit. Depending on your insurance coverage, you may receive a charge related to this service.  I need to obtain your verbal consent now. Are you willing to proceed with your visit today? [name] has provided verbal consent on 10/14/2022 for a virtual visit (video or telephone). Apolonio Schneiders, FNP  Date: 10/14/2022 6:18 PM  Virtual Visit via Video Note   I, Apolonio Schneiders, connected with  Melanie Wilkinson  ([id number], 08/15/1951) on 10/14/22 at  7:00 PM EST by a video-enabled telemedicine application and verified that I am speaking with the correct person using two identifiers.  Location: Patient: Virtual Visit Location Patient: Home Provider: Virtual  Visit Location Provider: Home Office   I discussed the limitations of evaluation and management by telemedicine and the availability of in person appointments. The patient expressed understanding and agreed to proceed.    History of Present Illness: Melanie Wilkinson is a 72 y.o. who identifies as a female who was assigned female at birth, and is being seen today for COVID.  She took a home test today and tested positive for COVID today Symptom onset was 3-4 days ago   Started with sore throat, has progressed to nasal congestion today  She is having some tenderness in her teeth She denies a fever  She has had COVID two time  First time she had antibody infusion  Second time she had COVID after a cruise she did not take any medications no complications   GFR 92 denies a history of kidney disease   Problems:  Patient Active Problem List   Diagnosis Date Noted   Fracture of patella, right, closed 08/23/2022   Well adult exam 01/19/2021   Osteoporosis 07/21/2020   Benign essential tremor 01/23/2020   Diverticula of colon 05/18/2017   Atrial septal aneurysm 05/16/2016   Back pain 04/06/2016   HLD (hyperlipidemia) 04/06/2016   Hypothyroidism 04/06/2016    Allergies: No Known Allergies Medications:  Current Outpatient Medications:    atorvastatin (LIPITOR) 40 MG tablet, TAKE 1 TABLET DAILY, Disp: 90 tablet, Rfl: 2   levothyroxine (SYNTHROID) 75 MCG tablet, TAKE 1 TABLET DAILY, Disp: 90 tablet, Rfl: 2  Observations/Objective: Patient is  well-developed, well-nourished in no acute distress.  Resting comfortably  at home.  Head is normocephalic, atraumatic.  No labored breathing.  Speech is clear and coherent with logical content.  Patient is alert and oriented at baseline.    Assessment and Plan:  1. COVID-19  - nirmatrelvir/ritonavir (PAXLOVID) 20 x 150 MG & 10 x 100MG TABS; Take 3 tablets by mouth 2 (two) times daily for 5 days. (Take nirmatrelvir 150 mg two tablets twice daily  for 5 days and ritonavir 100 mg one tablet twice daily for 5 days) Patient GFR is 92  Dispense: 30 tablet; Refill: 0     Use Flonase and OTC nasal decongestant as tolerated   Push fluids  Take anti-viral medication with food   Follow Up Instructions: I discussed the assessment and treatment plan with the patient. The patient was provided an opportunity to ask questions and all were answered. The patient agreed with the plan and demonstrated an understanding of the instructions.  A copy of instructions were sent to the patient via MyChart unless otherwise noted below.    The patient was advised to call back or seek an in-person evaluation if the symptoms worsen or if the condition fails to improve as anticipated.  Time:  I spent 15 minutes with the patient via telehealth technology discussing the above problems/concerns.    Apolonio Schneiders, FNP

## 2022-10-15 ENCOUNTER — Other Ambulatory Visit: Payer: Self-pay | Admitting: Nurse Practitioner

## 2022-10-15 DIAGNOSIS — U071 COVID-19: Secondary | ICD-10-CM

## 2022-10-15 MED ORDER — NIRMATRELVIR/RITONAVIR (PAXLOVID)TABLET: 3.0000 | ORAL_TABLET | Freq: Two times a day (BID) | ORAL | 0 refills | Status: AC

## 2022-10-19 ENCOUNTER — Ambulatory Visit: Payer: Medicare HMO

## 2022-10-19 DIAGNOSIS — M6281 Muscle weakness (generalized): Secondary | ICD-10-CM

## 2022-10-19 DIAGNOSIS — R2689 Other abnormalities of gait and mobility: Secondary | ICD-10-CM | POA: Diagnosis not present

## 2022-10-19 DIAGNOSIS — M25561 Pain in right knee: Secondary | ICD-10-CM | POA: Diagnosis not present

## 2022-10-19 DIAGNOSIS — M25661 Stiffness of right knee, not elsewhere classified: Secondary | ICD-10-CM

## 2022-10-19 NOTE — Therapy (Signed)
OUTPATIENT PHYSICAL THERAPY TREATMENT   Patient Name: Melanie Wilkinson MRN: [id number] DOB:1951/03/24, 72 y.o., female Today's Date: 10/19/2022  END OF SESSION:  PT End of Session - 10/19/22 0936     Visit Number 10    Number of Visits 16    Date for PT Re-Evaluation 11/09/22    Progress Note Due on Visit 10    PT Start Time 0932    PT Stop Time 1017    PT Time Calculation (min) 45 min    Activity Tolerance Patient tolerated treatment well    Behavior During Therapy [facility] for tasks assessed/performed              Past Medical History:  Diagnosis Date   Fibrocystic breast    Thyroid disease    Past Surgical History:  Procedure Laterality Date   BREAST BIOPSY     Patient Active Problem List   Diagnosis Date Noted   Fracture of patella, right, closed 08/23/2022   Well adult exam 01/19/2021   Osteoporosis 07/21/2020   Benign essential tremor 01/23/2020   Diverticula of colon 05/18/2017   Atrial septal aneurysm 05/16/2016   Back pain 04/06/2016   HLD (hyperlipidemia) 04/06/2016   Hypothyroidism 04/06/2016    PCP: Dr Luetta Nutting  REFERRING PROVIDER: Dr Aundria Mems   REFERRING DIAG: closed fx Rt patella   THERAPY DIAG:  Acute pain of right knee  Stiffness of right knee, not elsewhere classified  Muscle weakness (generalized)  Other abnormalities of gait and mobility  Rationale for Evaluation and Treatment: Rehabilitation  ONSET DATE: 08/04/22  SUBJECTIVE:   SUBJECTIVE STATEMENT: Patient reports stir navigation has improved significantly, reports no knee pain or a few days now. Patient states she still has "tingling/itchy" sensation in L thigh but it is decreasing.  PERTINENT HISTORY: Unremarkable per pt report   PAIN:  Are you having pain? Yes: NPRS scale: 0/10 Pain location: Rt knee  Pain description: ache Aggravating factors: bending knee  Relieving factors: rest   PRECAUTIONS: None  WEIGHT BEARING RESTRICTIONS: No  FALLS:  Has  patient fallen in last 6 months? Yes. 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 Darene Lamer  OBJECTIVE:   DIAGNOSTIC FINDINGS: xray 09/13/22: On the provided frontal and lateral views, the original fracture coronal lucency overlying the patella on frontal view is less well visualized, consistent with at least partial healing sclerosis.  EDEMA:  mild edema knee to ankle   MUSCLE LENGTH: Hamstrings: Right 80 deg; Left 90 deg  POSTURE: rounded shoulders, forward head, and weight shift left  PALPATION: Tender distal quad and peripatellar area Rt LE   LOWER EXTREMITY ROM:  Assessed in supine - hip and ankle ROM WFL's bilat  Active ROM Right eval Left eval Right  Right 10/19/22  Hip flexion      Hip extension      Hip abduction      Hip adduction      Hip internal rotation      Hip external rotation  Knee flexion 72 135 118 125  Knee extension -5 0 -5 0  Ankle dorsiflexion      Ankle plantarflexion      Ankle inversion      Ankle eversion       (Blank rows = not tested)  LOWER EXTREMITY MMT:   Rt LE not tested resistively - Lt LE WFL's   MMT Right eval Left eval Right 10/19/22  Hip flexion   4+  Hip extension   4+  Hip abduction   5  Hip adduction   5  Hip internal rotation   4+  Hip external rotation   4+  Knee flexion   55  Knee extension     Ankle dorsiflexion     Ankle plantarflexion     Ankle inversion     Ankle eversion      (Blank rows = not tested)  GAIT: Distance walked:  50 Assistive device utilized: Crutches Level of assistance: Complete Independence Comments: WBAT Rt LE good gait pattern with crutches    [facility] Adult PT Treatment:                                                DATE: 10/19/2022 Therapeutic Exercise: NuStep L5 x 60mn Knee flex/ext ROM measurement Hip & knee MMT Seated figure 4 piriformis stretch B Figure 4 LTR B Side plank clamshell GTB 2x10 B Prone bent knee hip extension x10 B Prone HS curls RTB 2x10 B Squat with glute tap on pillow (low mat table) x10, holding 10#DB x10 R SLB 3x30" R forward woodpecker x15 --> added small range leg left with hip hinge x10 R gastroc & soleus stretches on 4" step x 30" each   [facility] Adult PT Treatment:                                                DATE: 10/13/2022 Therapeutic Exercise: Recumbent bike L3 x515m Seated figure stretch (alt ER/IR) 5x10" each Self myofascial release/TPR piriformis with tennis ball S/L clamshells GTB 2x10  Prone bent knee hip extension x10 B Modified side plank --> added hip dips 6" step up with high knee --> lateral heel taps STS x10 --> squat with GTB above knees + seat tap on pillow x10 R SL leg press 45# 2x15   PATIENT EDUCATION:  Education details: Progressing HEP with bands; piriformis stretches Person educated: Patient Education method: Explanation, Demonstration, Tactile cues, Verbal cues, and Handouts Education comprehension: verbalized understanding, returned demonstration, verbal cues required, tactile cues required, and needs further education  HOME EXERCISE PROGRAM: Access Code: [email or web address]: [email or web address] Date: 10/13/2022 Prepared by: KaHelane GuntherExercises - Prone Quadriceps Stretch with Strap  - 2 x daily - 7 x weekly - 1 sets - 5-10 reps - 10 sec  hold - Small Range Straight Leg Raise  - 1 x daily - 7 x weekly - 3 sets - 10 reps - Standing Hip Extension Kicks  - 1 x daily - 7 x weekly - 3 sets - 10 reps - Standing Hip  Abduction Kicks  - 1 x daily - 7 x weekly - 3 sets - 10 reps - Mini Squat with Counter Support  - 1 x daily -  7 x weekly - 3 sets - 10 reps - Clam with Resistance  - 1 x daily - 7 x weekly - 3 sets - 10 reps - Bridge with Hip Abduction and Resistance - Ground Touches  - 1 x daily - 7 x weekly - 3 sets - 10 reps - Seated Knee Extension with Resistance  - 1 x daily - 7 x weekly - 3 sets - 10 reps - Standing Terminal Knee Extension with Resistance  - 1 x daily - 7 x weekly - 3 sets - 10 reps - Sidestepping in Squat with Resistance and Arms Forward  - 1 x daily - 7 x weekly - 3 sets - 10 reps - Prone Hip Extension  - 1 x daily - 7 x weekly - 3 sets - 10 reps - Seated Piriformis Stretch with Trunk Bend  - 2 x daily - 7 x weekly - 1 sets - 5 reps - 10-20 hold - Seated Piriformis Stretch  - 1 x daily - 7 x weekly - 3 sets - 10 reps - Supine Figure 4 Piriformis Stretch  - 1 x daily - 7 x weekly - 3 sets - 10 reps - Prone Hip Extension with Bent Knee  - 1 x daily - 7 x weekly - 3 sets - 10 reps - Side Plank on Knees  - 1 x daily - 7 x weekly - 3 sets - 10 reps  ASSESSMENT:  CLINICAL IMPRESSION: Patient demonstrates good progression in R hip and knee strength as measured by MMT; most significant increase in strength noted with hip abd and extension. Mild offloading of R LE noted during standing squats; verbal cueing improved alignment and equal weight bearing.   OBJECTIVE IMPAIRMENTS: Abnormal gait; dependent gait; decreased AROM and strength Rt LE; edema Rt LE; decresaed activity tolerance    GOALS: Goals reviewed with patient? Yes  SHORT TERM GOALS: Target date: 10/12/2022   Independent in initial HEP  Baseline: Goal status: MET 10/10/22  2.  Progress to least assistive device for safe ambulation  Baseline:  Goal status: MET 10/10/22   LONG TERM GOALS: Target date: 11/09/2022   Increase AROM Rt knee flexion to 130 degrees and extension to 0  Baseline:  Goal status: IN PROGRESS on  10/19/22  2.  5/5 strength Rt LE  Baseline:  Goal status: IN PROGRESS on 10/19/22  3.  Independent in ambulation for community distances without assistive device Baseline:  Goal status: MET on 10/19/22  4.  Independent in HEP including aquatic program as indicated  Baseline:  Goal status: MET on 10/19/22    PLAN:  PT FREQUENCY: 2x/week  PT DURATION: 8 weeks  PLANNED INTERVENTIONS: Therapeutic exercises, Therapeutic activity, Neuromuscular re-education, Balance training, Gait training, Patient/Family education, Self Care, Joint mobilization, Stair training, Aquatic Therapy, Dry Needling, Electrical stimulation, Cryotherapy, Moist heat, Taping, Vasopneumatic device, Ultrasound, Ionotophoresis 65m/ml Dexamethasone, Manual therapy, and Re-evaluation  PLAN FOR NEXT SESSION: Progress exercises to address ROM, strength, dynamic balance.  KHardin Negus PTA 10/19/2022, 10:17 AM

## 2022-10-24 ENCOUNTER — Ambulatory Visit: Payer: Medicare HMO

## 2022-10-24 DIAGNOSIS — M25561 Pain in right knee: Secondary | ICD-10-CM | POA: Diagnosis not present

## 2022-10-24 DIAGNOSIS — M25661 Stiffness of right knee, not elsewhere classified: Secondary | ICD-10-CM

## 2022-10-24 DIAGNOSIS — R2689 Other abnormalities of gait and mobility: Secondary | ICD-10-CM | POA: Diagnosis not present

## 2022-10-24 DIAGNOSIS — M6281 Muscle weakness (generalized): Secondary | ICD-10-CM | POA: Diagnosis not present

## 2022-10-24 NOTE — Therapy (Signed)
OUTPATIENT PHYSICAL THERAPY TREATMENT   Patient Name: Melanie Wilkinson MRN: [id number] DOB:08-22-1951, 72 y.o., female Today's Date: 10/24/2022  END OF SESSION:  PT End of Session - 10/24/22 0933     Visit Number 11    Number of Visits 16    Date for PT Re-Evaluation 11/09/22    PT Start Time 0930    PT Stop Time 1012    PT Time Calculation (min) 42 min    Activity Tolerance Patient tolerated treatment well    Behavior During Therapy [facility] for tasks assessed/performed              Past Medical History:  Diagnosis Date   Fibrocystic breast    Thyroid disease    Past Surgical History:  Procedure Laterality Date   BREAST BIOPSY     Patient Active Problem List   Diagnosis Date Noted   Fracture of patella, right, closed 08/23/2022   Well adult exam 01/19/2021   Osteoporosis 07/21/2020   Benign essential tremor 01/23/2020   Diverticula of colon 05/18/2017   Atrial septal aneurysm 05/16/2016   Back pain 04/06/2016   HLD (hyperlipidemia) 04/06/2016   Hypothyroidism 04/06/2016    PCP: Dr Luetta Nutting  REFERRING PROVIDER: Dr Aundria Mems   REFERRING DIAG: closed fx Rt patella   THERAPY DIAG:  Acute pain of right knee  Stiffness of right knee, not elsewhere classified  Muscle weakness (generalized)  Other abnormalities of gait and mobility  Rationale for Evaluation and Treatment: Rehabilitation  ONSET DATE: 08/04/22  SUBJECTIVE:   SUBJECTIVE STATEMENT: Patient reports no changes in L thigh tingling sensation; patient continues to have no pain in R knee.   PERTINENT HISTORY: Unremarkable per pt report   PAIN:  Are you having pain? Yes: NPRS scale: 0/10 Pain location: Rt knee  Pain description: ache Aggravating factors: bending knee  Relieving factors: rest   PRECAUTIONS: None  WEIGHT BEARING RESTRICTIONS: No  FALLS:  Has patient fallen in last 6 months? Yes. 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 Darene Lamer  OBJECTIVE:   DIAGNOSTIC FINDINGS: xray 09/13/22: On the provided frontal and lateral views, the original fracture coronal lucency overlying the patella on frontal view is less well visualized, consistent with at least partial healing sclerosis.  EDEMA:  mild edema knee to ankle   MUSCLE LENGTH: Hamstrings: Right 80 deg; Left 90 deg  POSTURE: rounded shoulders, forward head, and weight shift left  PALPATION: Tender distal quad and peripatellar area Rt LE   LOWER EXTREMITY ROM:  Assessed in supine - hip and ankle ROM WFL's bilat  Active ROM Right eval Left eval Right  Right 10/19/22  Hip flexion      Hip extension      Hip abduction      Hip adduction      Hip internal rotation      Hip external rotation      Knee flexion 72 135 118 125  Knee extension -5 0 -5 0  Ankle dorsiflexion  Ankle plantarflexion      Ankle inversion      Ankle eversion       (Blank rows = not tested)  LOWER EXTREMITY MMT:   Rt LE not tested resistively - Lt LE WFL's   MMT Right eval Left eval Right 10/19/22  Hip flexion   4+  Hip extension   4+  Hip abduction   5  Hip adduction   5  Hip internal rotation   4+  Hip external rotation   4+  Knee flexion   55  Knee extension     Ankle dorsiflexion     Ankle plantarflexion     Ankle inversion     Ankle eversion      (Blank rows = not tested)  GAIT: Distance walked: 50 Assistive device utilized: Crutches Level of assistance: Complete Independence Comments: WBAT Rt LE good gait pattern  with crutches     [facility] Adult PT Treatment:                                                DATE: 10/24/2022 Therapeutic Exercise: Recumbent bike warm up L4 x70mn Bent knee side stepping GTB above knees 2L Woodpecker (R) back foot down x10 --> back foot lifts x10 Lateral & bkwd sliders (R SLS) x10  Resisted half moons hip circles RTB x10 B Resisted diagonal stepping fwd/bkwd GTB crossed at ankles Supine piriformis stretch (L) --> L figure 4 LTR x10 Break the stick 3x10" each leg Myofascial self-massage L piriformis/superior glutes Captain morgan 5x5"  Modified single leg deadlift + 15#KB suitcase hold x10 B    [facility] Adult PT Treatment:                                                DATE: 10/19/2022 Therapeutic Exercise: NuStep L5 x 555m Knee flex/ext ROM measurement Hip & knee MMT Seated figure 4 piriformis stretch B Figure 4 LTR B Side plank clamshell GTB 2x10 B Prone bent knee hip extension x10 B Prone HS curls RTB 2x10 B Squat with glute tap on pillow (low mat table) x10, holding 10#DB x10 R SLB 3x30" R forward woodpecker x15 --> added small range leg left with hip hinge x10 R gastroc & soleus stretches on 4" step x 30" each   PATIENT EDUCATION:  Education details: Progressing HEP with bands; piriformis stretches Person educated: Patient Education method: Explanation, Demonstration, Tactile cues, Verbal cues, and Handouts Education comprehension: verbalized understanding, returned demonstration, verbal cues required, tactile cues required, and needs further education  HOME EXERCISE PROGRAM: Access Code: [email or web address]: [email or web address] Date: 10/13/2022 Prepared by: KaHelane GuntherExercises - Prone Quadriceps Stretch with Strap  - 2 x daily - 7 x weekly - 1 sets - 5-10 reps - 10 sec  hold - Small Range Straight Leg Raise  - 1 x daily - 7 x weekly - 3 sets - 10 reps - Standing Hip Extension Kicks  - 1 x daily - 7 x weekly - 3 sets - 10 reps - Standing  Hip Abduction Kicks  - 1 x daily - 7 x weekly - 3 sets - 10 reps - Mini Squat with Counter Support  - 1 x daily - 7 x  weekly - 3 sets - 10 reps - Clam with Resistance  - 1 x daily - 7 x weekly - 3 sets - 10 reps - Bridge with Hip Abduction and Resistance - Ground Touches  - 1 x daily - 7 x weekly - 3 sets - 10 reps - Seated Knee Extension with Resistance  - 1 x daily - 7 x weekly - 3 sets - 10 reps - Standing Terminal Knee Extension with Resistance  - 1 x daily - 7 x weekly - 3 sets - 10 reps - Sidestepping in Squat with Resistance and Arms Forward  - 1 x daily - 7 x weekly - 3 sets - 10 reps - Prone Hip Extension  - 1 x daily - 7 x weekly - 3 sets - 10 reps - Seated Piriformis Stretch with Trunk Bend  - 2 x daily - 7 x weekly - 1 sets - 5 reps - 10-20 hold - Seated Piriformis Stretch  - 1 x daily - 7 x weekly - 3 sets - 10 reps - Supine Figure 4 Piriformis Stretch  - 1 x daily - 7 x weekly - 3 sets - 10 reps - Prone Hip Extension with Bent Knee  - 1 x daily - 7 x weekly - 3 sets - 10 reps - Side Plank on Knees  - 1 x daily - 7 x weekly - 3 sets - 10 reps  ASSESSMENT:  CLINICAL IMPRESSION: Dynamic hip strengthening continued with resisted stepping variations, challenging dynamic balance with NBOS. Single leg stance strengthening progressed to modified single leg deadlifts with unilateral kettlebell resistance; verbal cues improved postural alignment and stability.   OBJECTIVE IMPAIRMENTS: Abnormal gait; dependent gait; decreased AROM and strength Rt LE; edema Rt LE; decresaed activity tolerance    GOALS: Goals reviewed with patient? Yes  SHORT TERM GOALS: Target date: 10/12/2022   Independent in initial HEP  Baseline: Goal status: MET 10/10/22  2.  Progress to least assistive device for safe ambulation  Baseline:  Goal status: MET 10/10/22   LONG TERM GOALS: Target date: 11/09/2022   Increase AROM Rt knee flexion to 130 degrees and extension to 0  Baseline:  Goal status: IN PROGRESS  on 10/19/22  2.  5/5 strength Rt LE  Baseline:  Goal status: IN PROGRESS on 10/19/22  3.  Independent in ambulation for community distances without assistive device Baseline:  Goal status: MET on 10/19/22  4.  Independent in HEP including aquatic program as indicated  Baseline:  Goal status: MET on 10/19/22    PLAN:  PT FREQUENCY: 2x/week  PT DURATION: 8 weeks  PLANNED INTERVENTIONS: Therapeutic exercises, Therapeutic activity, Neuromuscular re-education, Balance training, Gait training, Patient/Family education, Self Care, Joint mobilization, Stair training, Aquatic Therapy, Dry Needling, Electrical stimulation, Cryotherapy, Moist heat, Taping, Vasopneumatic device, Ultrasound, Ionotophoresis 10m/ml Dexamethasone, Manual therapy, and Re-evaluation  PLAN FOR NEXT SESSION: Progress exercises to address ROM, strength, dynamic balance.  KHardin Negus PTA 10/24/2022, 10:16 AM

## 2022-10-25 ENCOUNTER — Ambulatory Visit (INDEPENDENT_AMBULATORY_CARE_PROVIDER_SITE_OTHER): Payer: Medicare HMO | Admitting: Sports Medicine

## 2022-10-25 ENCOUNTER — Ambulatory Visit (INDEPENDENT_AMBULATORY_CARE_PROVIDER_SITE_OTHER): Payer: Medicare HMO

## 2022-10-25 DIAGNOSIS — M25531 Pain in right wrist: Secondary | ICD-10-CM | POA: Diagnosis not present

## 2022-10-25 DIAGNOSIS — R200 Anesthesia of skin: Secondary | ICD-10-CM

## 2022-10-25 DIAGNOSIS — R202 Paresthesia of skin: Secondary | ICD-10-CM

## 2022-10-25 DIAGNOSIS — S82091D Other fracture of right patella, subsequent encounter for closed fracture with routine healing: Secondary | ICD-10-CM

## 2022-10-25 DIAGNOSIS — M5136 Other intervertebral disc degeneration, lumbar region: Secondary | ICD-10-CM | POA: Diagnosis not present

## 2022-10-25 MED ORDER — TRIAMCINOLONE ACETONIDE 40 MG/ML IJ SUSP
40.0000 mg | Freq: Once | INTRAMUSCULAR | Status: AC
  Administered 2022-10-25: 40 mg via INTRAMUSCULAR

## 2022-10-25 NOTE — Assessment & Plan Note (Signed)
Approximately 12 weeks now post comminuted patella fracture, she has done well with therapy, good motion, good strength, no further intervention needed.

## 2022-10-25 NOTE — Progress Notes (Addendum)
    Procedures performed today:    None.  Independent interpretation of notes and tests performed by another provider:   None.  Brief History, Exam, Impression, and Recommendations:    Fracture of patella, right, closed Approximately 12 weeks now post comminuted patella fracture, she has done well with therapy, good motion, good strength, no further intervention needed.  Numbness and tingling of left leg Melanie Wilkinson has also noted years of numbness and tingling anterolateral left upper leg. Nothing past the knee. Minimal back pain. Has failed over 10 sessions of chiropractic manipulation. On exam she has no tenderness and a negative Tinel sign over the origin of the lateral femoral cutaneous nerve. I do suspect this more of a radicular process, we will get lumbar spine x-rays, adding home conditioning, if no better in 6 weeks we will consider gabapentin and MRI.    ____________________________________________ Gwen Her. Dianah Field, M.D., ABFM., CAQSM., AME. Primary Care and Sports Medicine [facility]  Adjunct Professor of [employer]  Risk manager

## 2022-10-25 NOTE — Assessment & Plan Note (Signed)
Melanie Wilkinson has also noted years of numbness and tingling anterolateral left upper leg. Nothing past the knee. Minimal back pain. Has failed over 10 sessions of chiropractic manipulation. On exam she has no tenderness and a negative Tinel sign over the origin of the lateral femoral cutaneous nerve. I do suspect this more of a radicular process, we will get lumbar spine x-rays, adding home conditioning, if no better in 6 weeks we will consider gabapentin and MRI.

## 2022-10-26 ENCOUNTER — Ambulatory Visit (INDEPENDENT_AMBULATORY_CARE_PROVIDER_SITE_OTHER): Payer: Medicare HMO

## 2022-10-26 ENCOUNTER — Ambulatory Visit: Payer: Medicare HMO

## 2022-10-26 DIAGNOSIS — M810 Age-related osteoporosis without current pathological fracture: Secondary | ICD-10-CM

## 2022-10-26 DIAGNOSIS — Z780 Asymptomatic menopausal state: Secondary | ICD-10-CM | POA: Diagnosis not present

## 2022-10-26 DIAGNOSIS — R2689 Other abnormalities of gait and mobility: Secondary | ICD-10-CM

## 2022-10-26 DIAGNOSIS — Z1231 Encounter for screening mammogram for malignant neoplasm of breast: Secondary | ICD-10-CM | POA: Diagnosis not present

## 2022-10-26 DIAGNOSIS — M6281 Muscle weakness (generalized): Secondary | ICD-10-CM | POA: Diagnosis not present

## 2022-10-26 DIAGNOSIS — M25661 Stiffness of right knee, not elsewhere classified: Secondary | ICD-10-CM | POA: Diagnosis not present

## 2022-10-26 DIAGNOSIS — M25561 Pain in right knee: Secondary | ICD-10-CM | POA: Diagnosis not present

## 2022-10-26 DIAGNOSIS — M8588 Other specified disorders of bone density and structure, other site: Secondary | ICD-10-CM | POA: Diagnosis not present

## 2022-10-26 NOTE — Therapy (Addendum)
OUTPATIENT PHYSICAL THERAPY TREATMENT AND DISCHARGE SUMMARY   PHYSICAL THERAPY DISCHARGE SUMMARY  Visits from Start of Care: 12  Current functional level related to goals / functional outcomes: See progress note for discharge status   Remaining deficits: Needs to continue with strengthening and stretching    Education / Equipment: HEP    Patient agrees to discharge. Patient goals were partially met. Patient is being discharged due to being pleased with the current functional level. Celyn P. Helene Kelp PT, MPH 10/26/22 4:24 PM  Patient Name: Melanie Wilkinson MRN: [id number] DOB:[birth year], 72 y.o., female Today's Date: 10/26/2022  END OF SESSION:  PT End of Session - 10/26/22 1154     Visit Number 12    Number of Visits 16    Date for PT Re-Evaluation 11/09/22    PT Start Time [id number]    PT Stop Time 1230    PT Time Calculation (min) 45 min    Activity Tolerance Patient tolerated treatment well    Behavior During Therapy [facility] for tasks assessed/performed              Past Medical History:  Diagnosis Date   Fibrocystic breast    Thyroid disease    Past Surgical History:  Procedure Laterality Date   BREAST BIOPSY     Patient Active Problem List   Diagnosis Date Noted   Numbness and tingling of left leg 10/25/2022   Fracture of patella, right, closed 08/23/2022   Well adult exam 01/19/2021   Osteoporosis 07/21/2020   Benign essential tremor 01/23/2020   Diverticula of colon 05/18/2017   Atrial septal aneurysm 05/16/2016   Back pain 04/06/2016   HLD (hyperlipidemia) 04/06/2016   Hypothyroidism 04/06/2016    PCP: Dr Luetta Nutting  REFERRING PROVIDER: Dr Aundria Mems   REFERRING DIAG: closed fx Rt patella   THERAPY DIAG:  Acute pain of right knee  Stiffness of right knee, not elsewhere classified  Muscle weakness (generalized)  Other abnormalities of gait and mobility  Rationale for Evaluation and Treatment: Rehabilitation  ONSET DATE:  08/04/22  SUBJECTIVE:   SUBJECTIVE STATEMENT: Patient reports she continues to have no pain in R knee with occasional minimal pain in knee when ascending/descending stairs (worse on descend). Patient states the N/T in L thigh bothers her more.  PERTINENT HISTORY: Unremarkable per pt report   PAIN:  Are you having pain? Yes: NPRS scale: 0/10 Pain location: Rt knee  Pain description: ache Aggravating factors: bending knee  Relieving factors: rest   PRECAUTIONS: None  WEIGHT BEARING RESTRICTIONS: No  FALLS:  Has patient fallen in last 6 months? Yes. 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 Darene Lamer  OBJECTIVE:   DIAGNOSTIC FINDINGS: xray 09/13/22: On the provided frontal and lateral views, the original fracture coronal lucency overlying the patella on frontal view is less well visualized, consistent with at least partial healing sclerosis.  EDEMA:  mild edema knee to ankle   MUSCLE LENGTH: Hamstrings: Right 80 deg; Left 90 deg  POSTURE: rounded shoulders, forward  head, and weight shift left  PALPATION: Tender distal quad and peripatellar area Rt LE   LOWER EXTREMITY ROM:  Assessed in supine - hip and ankle ROM WFL's bilat  Active ROM Right eval Left eval Right  Right 10/19/22 Right  10/26/22  Hip flexion       Hip extension       Hip abduction       Hip adduction       Hip internal rotation       Hip external rotation       Knee flexion 72 135 118 125 130  Knee extension -5 0 -5 0 -2   Ankle dorsiflexion       Ankle plantarflexion       Ankle inversion       Ankle eversion        (Blank rows = not tested)  LOWER EXTREMITY MMT:   Rt LE not tested resistively - Lt LE WFL's   MMT Right eval Left eval Right 10/19/22 Right 10/26/22  Hip flexion   4+ 5  Hip extension   4+ 5  Hip abduction   5 5  Hip adduction   5 5  Hip internal rotation   4+ 5  Hip external rotation   4+ 5  Knee flexion   5 5  Knee extension      Ankle dorsiflexion      Ankle plantarflexion      Ankle inversion      Ankle eversion       (Blank rows = not tested)  GAIT: Distance walked: 50 Assistive device utilized: Crutches Level of assistance: Complete Independence Comments: WBAT Rt LE good gait pattern with crutches    [facility] Adult PT Treatment:                                                DATE: 10/26/2022 Therapeutic Exercise: Recumbent bike warm up L5 x 5 min + subjective intake Seated HS stretch (foot propped on mat) 5x10" B Seated figure 4 stretch 2x30" B Bent knee side stepping BTB crossed at ankles 2L Fwd monster walks BTB 2L Mat Table: Staggered stance bridges 2x10 B --> figure 4 bridges x10 B Side plank + clamshells RTB  Stairs: lateral heel tap downs 4" step --> front heel taps 6" step   [facility] Adult PT Treatment:                                                DATE: 10/24/2022 Therapeutic Exercise: Recumbent bike warm up L4 x96mn Bent knee side stepping GTB above knees 2L Woodpecker (R) back foot down x10 --> back foot lifts x10 Lateral & bkwd sliders (R SLS) x10  Resisted half moons hip circles RTB x10 B Resisted diagonal stepping fwd/bkwd GTB crossed at ankles Supine piriformis stretch (L) --> L figure 4 LTR x10 Break the stick 3x10" each leg Myofascial self-massage L piriformis/superior glutes Captain morgan 5x5"  Modified single leg deadlift + 15#KB suitcase hold x10 B   PATIENT EDUCATION:  Education details: Updated HEP Person educated: Patient Education  method: Explanation, Demonstration, Tactile cues, Verbal cues, and Handouts Education comprehension: verbalized understanding, returned demonstration, verbal cues required, tactile cues required, and  needs further education  HOME EXERCISE PROGRAM: Access Code: [email or web address] URL: [email or web address] Date: 10/26/2022 Prepared by: Helane Gunther  Exercises - Prone Quadriceps Stretch with Strap  - 2 x daily - 7 x weekly - 1 sets - 5-10 reps - 10 sec  hold - Prone Hip Extension  - 1 x daily - 7 x weekly - 3 sets - 10 reps - Seated Piriformis Stretch with Trunk Bend  - 2 x daily - 7 x weekly - 1 sets - 5 reps - 10-20 hold - Side Plank with Clam and Resistance  - 1 x daily - 7 x weekly - 3 sets - 10 reps - Modified Single-Leg Deadlift  - 1 x daily - 7 x weekly - 3 sets - 10 reps - Sidestepping in Squat with Resistance and Arms Forward  - 1 x daily - 7 x weekly - 3 sets - 10 reps - Forward Monster Walks  - 1 x daily - 7 x weekly - 3 sets - 10 reps - Backward Monster Walks  - 1 x daily - 7 x weekly - 3 sets - 10 reps - Forward Step Down Touch with Heel  - 1 x daily - 7 x weekly - 3 sets - 10 reps   ASSESSMENT:  CLINICAL IMPRESSION: Right knee AROM improved to 130 degrees flexion and -2 extension; hip and knee strength improved within normal limits as measured by MMT. HEP updated to progress LE strengthening and continue progression of strength exercises. Heel taps performed on top step with minimal pain in R knee; cueing for posterior weight shifting decreased knee discomfort.    OBJECTIVE IMPAIRMENTS: Abnormal gait; dependent gait; decreased AROM and strength Rt LE; edema Rt LE; decresaed activity tolerance    GOALS: Goals reviewed with patient? Yes  SHORT TERM GOALS: Target date: 10/12/2022   Independent in initial HEP  Baseline: Goal status: MET 10/10/22  2.  Progress to least assistive device for safe ambulation  Baseline:  Goal status: MET 10/10/22   LONG TERM GOALS:  Target date: 11/09/2022   Increase AROM Rt knee flexion to 130 degrees and extension to 0  Baseline:  Goal status: MET on 10/26/22  2.  5/5 strength Rt LE  Baseline:  Goal status: MET on 10/26/22  3.  Independent in ambulation for community distances without assistive device Baseline:  Goal status: MET on 10/19/22  4.  Independent in HEP including aquatic program as indicated  Baseline:  Goal status: MET on 10/19/22    PLAN:  PT FREQUENCY: 2x/week  PT DURATION: 8 weeks  PLANNED INTERVENTIONS: Therapeutic exercises, Therapeutic activity, Neuromuscular re-education, Balance training, Gait training, Patient/Family education, Self Care, Joint mobilization, Stair training, Aquatic Therapy, Dry Needling, Electrical stimulation, Cryotherapy, Moist heat, Taping, Vasopneumatic device, Ultrasound, Ionotophoresis 55m/ml Dexamethasone, Manual therapy, and Re-evaluation  PLAN FOR NEXT SESSION: Discharge  KHardin Negus PTA 10/26/2022, 12:31 PM

## 2022-10-27 ENCOUNTER — Encounter: Payer: Self-pay | Admitting: Sports Medicine

## 2022-10-27 NOTE — Addendum Note (Signed)
Addended by: Silverio Decamp on: 10/27/2022 08:59 AM   Modules accepted: Level of Service

## 2022-11-06 ENCOUNTER — Other Ambulatory Visit: Payer: Self-pay | Admitting: Family Medicine

## 2022-11-06 DIAGNOSIS — E038 Other specified hypothyroidism: Secondary | ICD-10-CM

## 2022-11-06 DIAGNOSIS — E785 Hyperlipidemia, unspecified: Secondary | ICD-10-CM

## 2022-12-06 ENCOUNTER — Ambulatory Visit: Payer: Medicare HMO | Admitting: Sports Medicine

## 2022-12-12 ENCOUNTER — Ambulatory Visit: Payer: Medicare HMO | Admitting: Sports Medicine

## 2022-12-20 ENCOUNTER — Telehealth: Payer: Self-pay | Admitting: Family Medicine

## 2022-12-20 DIAGNOSIS — Z0000 Encounter for general adult medical examination without abnormal findings: Secondary | ICD-10-CM

## 2022-12-20 DIAGNOSIS — E038 Other specified hypothyroidism: Secondary | ICD-10-CM

## 2022-12-20 DIAGNOSIS — E785 Hyperlipidemia, unspecified: Secondary | ICD-10-CM

## 2022-12-20 DIAGNOSIS — M810 Age-related osteoporosis without current pathological fracture: Secondary | ICD-10-CM

## 2022-12-20 NOTE — Telephone Encounter (Signed)
Pt is scheduled for a physical on 01/03/2023 and would like to have her blood work done before her appointment.

## 2022-12-20 NOTE — Telephone Encounter (Signed)
Lab orders entered.   Thanks!  CM

## 2022-12-23 DIAGNOSIS — H524 Presbyopia: Secondary | ICD-10-CM | POA: Diagnosis not present

## 2022-12-23 DIAGNOSIS — Z0100 Encounter for examination of eyes and vision without abnormal findings: Secondary | ICD-10-CM | POA: Diagnosis not present

## 2023-01-02 ENCOUNTER — Telehealth: Payer: Self-pay | Admitting: Family Medicine

## 2023-01-02 DIAGNOSIS — E785 Hyperlipidemia, unspecified: Secondary | ICD-10-CM | POA: Diagnosis not present

## 2023-01-02 DIAGNOSIS — Z0000 Encounter for general adult medical examination without abnormal findings: Secondary | ICD-10-CM | POA: Diagnosis not present

## 2023-01-02 DIAGNOSIS — M810 Age-related osteoporosis without current pathological fracture: Secondary | ICD-10-CM | POA: Diagnosis not present

## 2023-01-02 DIAGNOSIS — E038 Other specified hypothyroidism: Secondary | ICD-10-CM | POA: Diagnosis not present

## 2023-01-02 LAB — CBC WITH DIFFERENTIAL/PLATELET
Absolute Monocytes: 441 cells/uL (ref 200–950)
Basophils Absolute: 72 cells/uL (ref 0–200)
Basophils Relative: 1.9 %
Eosinophils Absolute: 91 cells/uL (ref 15–500)
MCV: 92.1 fL (ref 80.0–100.0)
MPV: 11.3 fL (ref 7.5–12.5)
Platelets: 374 10*3/uL (ref 140–400)
WBC: 3.8 10*3/uL (ref 3.8–10.8)

## 2023-01-02 NOTE — Telephone Encounter (Signed)
Contacted Calton Golds to schedule their annual wellness visit. Appointment made for 02/07/2023 at 1PM.  Cira Servant Patient Access Advocate II Direct Dial: [phone]

## 2023-01-03 ENCOUNTER — Encounter: Payer: Self-pay | Admitting: Family Medicine

## 2023-01-03 ENCOUNTER — Ambulatory Visit (INDEPENDENT_AMBULATORY_CARE_PROVIDER_SITE_OTHER): Payer: Medicare HMO | Admitting: Family Medicine

## 2023-01-03 ENCOUNTER — Encounter: Payer: Medicare HMO | Admitting: Family Medicine

## 2023-01-03 VITALS — BP 124/71 | HR 76 | Ht 64.0 in | Wt 147.0 lb

## 2023-01-03 DIAGNOSIS — R7989 Other specified abnormal findings of blood chemistry: Secondary | ICD-10-CM

## 2023-01-03 DIAGNOSIS — Z0000 Encounter for general adult medical examination without abnormal findings: Secondary | ICD-10-CM

## 2023-01-03 LAB — LIPID PANEL W/REFLEX DIRECT LDL
Cholesterol: 183 mg/dL (ref <200)
HDL: 71 mg/dL (ref >=50)
LDL Cholesterol (Calc): 87 mg/dL (calc)
Non-HDL Cholesterol (Calc): 112 mg/dL (calc) (ref <130)
Total CHOL/HDL Ratio: 2.6 (calc) (ref <5.0)
Triglycerides: 149 mg/dL (ref <150)

## 2023-01-03 LAB — COMPLETE METABOLIC PANEL WITH GFR
AG Ratio: 1.6 (calc) (ref 1.0–2.5)
ALT: 58 U/L — ABNORMAL HIGH (ref 6–29)
AST: 37 U/L — ABNORMAL HIGH (ref 10–35)
Albumin: 4.4 g/dL (ref 3.6–5.1)
Alkaline phosphatase (APISO): 65 U/L (ref 37–153)
BUN: 16 mg/dL (ref 7–25)
CO2: 27 mmol/L (ref 20–32)
Calcium: 9.9 mg/dL (ref 8.6–10.4)
Chloride: 105 mmol/L (ref 98–110)
Creat: 0.68 mg/dL (ref 0.60–1.00)
Globulin: 2.7 g/dL (calc) (ref 1.9–3.7)
Glucose, Bld: 89 mg/dL (ref 65–99)
Potassium: 4.3 mmol/L (ref 3.5–5.3)
Sodium: 140 mmol/L (ref 135–146)
Total Bilirubin: 0.7 mg/dL (ref 0.2–1.2)
Total Protein: 7.1 g/dL (ref 6.1–8.1)
eGFR: 91 mL/min/{1.73_m2} (ref >=60)

## 2023-01-03 LAB — CBC WITH DIFFERENTIAL/PLATELET
Eosinophils Relative: 2.4 %
HCT: 45.4 % — ABNORMAL HIGH (ref 35.0–45.0)
Hemoglobin: 14.7 g/dL (ref 11.7–15.5)
Lymphs Abs: 1680 cells/uL (ref 850–3900)
MCH: 29.8 pg (ref 27.0–33.0)
MCHC: 32.4 g/dL (ref 32.0–36.0)
Monocytes Relative: 11.6 %
Neutro Abs: 1516 cells/uL (ref 1500–7800)
Neutrophils Relative %: 39.9 %
RBC: 4.93 10*6/uL (ref 3.80–5.10)
RDW: 12.7 % (ref 11.0–15.0)
Total Lymphocyte: 44.2 %

## 2023-01-03 LAB — VITAMIN D 25 HYDROXY (VIT D DEFICIENCY, FRACTURES): Vit D, 25-Hydroxy: 50 ng/mL (ref 30–100)

## 2023-01-03 LAB — TSH: TSH: 1.44 mIU/L (ref 0.40–4.50)

## 2023-01-03 NOTE — Patient Instructions (Signed)
Preventive Care 65 Years and Older, Female Preventive care refers to lifestyle choices and visits with your health care provider that can promote health and wellness. Preventive care visits are also called wellness exams. What can I expect for my preventive care visit? Counseling Your health care provider may ask you questions about your: Medical history, including: Past medical problems. Family medical history. Pregnancy and menstrual history. History of falls. Current health, including: Memory and ability to understand (cognition). Emotional well-being. Home life and relationship well-being. Sexual activity and sexual health. Lifestyle, including: Alcohol, nicotine or tobacco, and drug use. Access to firearms. Diet, exercise, and sleep habits. Work and work environment. Sunscreen use. Safety issues such as seatbelt and bike helmet use. Physical exam Your health care provider will check your: Height and weight. These may be used to calculate your BMI (body mass index). BMI is a measurement that tells if you are at a healthy weight. Waist circumference. This measures the distance around your waistline. This measurement also tells if you are at a healthy weight and may help predict your risk of certain diseases, such as type 2 diabetes and high blood pressure. Heart rate and blood pressure. Body temperature. Skin for abnormal spots. What immunizations do I need?  Vaccines are usually given at various ages, according to a schedule. Your health care provider will recommend vaccines for you based on your age, medical history, and lifestyle or other factors, such as travel or where you work. What tests do I need? Screening Your health care provider may recommend screening tests for certain conditions. This may include: Lipid and cholesterol levels. Hepatitis C test. Hepatitis B test. HIV (human immunodeficiency virus) test. STI (sexually transmitted infection) testing, if you are at  risk. Lung cancer screening. Colorectal cancer screening. Diabetes screening. This is done by checking your blood sugar (glucose) after you have not eaten for a while (fasting). Mammogram. Talk with your health care provider about how often you should have regular mammograms. BRCA-related cancer screening. This may be done if you have a family history of breast, ovarian, tubal, or peritoneal cancers. Bone density scan. This is done to screen for osteoporosis. Talk with your health care provider about your test results, treatment options, and if necessary, the need for more tests. Follow these instructions at home: Eating and drinking  Eat a diet that includes fresh fruits and vegetables, whole grains, lean protein, and low-fat dairy products. Limit your intake of foods with high amounts of sugar, saturated fats, and salt. Take vitamin and mineral supplements as recommended by your health care provider. Do not drink alcohol if your health care provider tells you not to drink. If you drink alcohol: Limit how much you have to 0-1 drink a day. Know how much alcohol is in your drink. In the [place] one drink equals one 12 oz bottle of beer (355 mL), one 5 oz glass of wine (148 mL), or one 1 oz glass of hard liquor (44 mL). Lifestyle Brush your teeth every morning and night with fluoride toothpaste. Floss one time each day. Exercise for at least 30 minutes 5 or more days each week. Do not use any products that contain nicotine or tobacco. These products include cigarettes, chewing tobacco, and vaping devices, such as e-cigarettes. If you need help quitting, ask your health care provider. Do not use drugs. If you are sexually active, practice safe sex. Use a condom or other form of protection in order to prevent STIs. Take aspirin only as told by   your health care provider. Make sure that you understand how much to take and what form to take. Work with your health care provider to find out whether it  is safe and beneficial for you to take aspirin daily. Ask your health care provider if you need to take a cholesterol-lowering medicine (statin). Find healthy ways to manage stress, such as: Meditation, yoga, or listening to music. Journaling. Talking to a trusted person. Spending time with friends and family. Minimize exposure to UV radiation to reduce your risk of skin cancer. Safety Always wear your seat belt while driving or riding in a vehicle. Do not drive: If you have been drinking alcohol. Do not ride with someone who has been drinking. When you are tired or distracted. While texting. If you have been using any mind-altering substances or drugs. Wear a helmet and other protective equipment during sports activities. If you have firearms in your house, make sure you follow all gun safety procedures. What's next? Visit your health care provider once a year for an annual wellness visit. Ask your health care provider how often you should have your eyes and teeth checked. Stay up to date on all vaccines. This information is not intended to replace advice given to you by your health care provider. Make sure you discuss any questions you have with your health care provider. Document Revised: 02/17/2021 Document Reviewed: 02/17/2021 Elsevier Patient Education  2023 Elsevier Inc.   

## 2023-01-03 NOTE — Assessment & Plan Note (Signed)
Well adult Recent labs reviewed in detail-Recheck LFT"s in 2-3 weeks.  Immunizations: UTD Screenings: UTD Anticipatory guidance/Risk factor reduction: Recommendations per AVS.

## 2023-01-03 NOTE — Progress Notes (Signed)
Melanie Wilkinson - 72 y.o. female MRN [id number]  Date of birth: [birth year]  Subjective Chief Complaint  Patient presents with   Annual Exam    HPI Melanie Wilkinson is a 72 y.o. female here today for annual exam.    She reports that she is doing well.  Has small lump in abdominal wall that bothers her when bending sometimes.  She also has small nodule along L cheek area  She continues to stay fairly active.  She feels like her diet is pretty good.   She had labs completed yesterday which we reviewed in detail.   She is a non-smoker.  Occasional EtOH use.   Review of Systems  Constitutional:  Negative for chills, fever, malaise/fatigue and weight loss.  HENT:  Negative for congestion, ear pain and sore throat.   Eyes:  Negative for blurred vision, double vision and pain.  Respiratory:  Negative for cough and shortness of breath.   Cardiovascular:  Negative for chest pain and palpitations.  Gastrointestinal:  Negative for abdominal pain, blood in stool, constipation, heartburn and nausea.  Genitourinary:  Negative for dysuria and urgency.  Musculoskeletal:  Negative for joint pain and myalgias.  Neurological:  Negative for dizziness and headaches.  Endo/Heme/Allergies:  Does not bruise/bleed easily.  Psychiatric/Behavioral:  Negative for depression. The patient is not nervous/anxious and does not have insomnia.     No Known Allergies  Past Medical History:  Diagnosis Date   Fibrocystic breast    Thyroid disease     Past Surgical History:  Procedure Laterality Date   BREAST BIOPSY      Social History   Socioeconomic History   Marital status: Married    Spouse name: Merlyn Albert   Number of children: 2   Years of education: 12   Highest education level: 12th grade  Occupational History   Occupation: Customer service manager    Comment: retired  Tobacco Use   Smoking status: Former   Smokeless tobacco: Never  Building services engineer Use: Never used  Substance and Sexual Activity   Alcohol  use: Yes    Alcohol/week: 1.0 standard drink of alcohol    Types: 1 Glasses of wine per week    Comment: occasional   Drug use: No   Sexual activity: Yes  Other Topics Concern   Not on file  Social History Narrative   Workout then walks   Crafts   Reading   tv watching   Social Determinants of Health   Financial Resource Strain: Low Risk  (02/05/2019)   Overall Financial Resource Strain (CARDIA)    Difficulty of Paying Living Expenses: Not hard at all  Food Insecurity: No Food Insecurity (12/08/2022)   Hunger Vital Sign    Worried About Running Out of Food in the Last Year: Never true    Ran Out of Food in the Last Year: Never true  Transportation Needs: No Transportation Needs (12/08/2022)   PRAPARE - Administrator, Civil Service (Medical): No    Lack of Transportation (Non-Medical): No  Physical Activity: Sufficiently Active (12/08/2022)   Exercise Vital Sign    Days of Exercise per Week: 5 days    Minutes of Exercise per Session: 30 min  Stress: No Stress Concern Present (12/08/2022)   [employer] of Occupational Health - Occupational Stress Questionnaire    Feeling of Stress : Not at all  Social Connections: Socially Integrated (12/08/2022)   Social Connection and Isolation Panel [NHANES]    Frequency  of Communication with Friends and Family: Three times a week    Frequency of Social Gatherings with Friends and Family: Twice a week    Attends Religious Services: More than 4 times per year    Active Member of Clubs or Organizations: Yes    Attends Engineer, structural: More than 4 times per year    Marital Status: Married    Family History  Problem Relation Age of Onset   Hypertension Mother    Hypertension Father     Health Maintenance  Topic Date Due   Zoster Vaccines- Shingrix (1 of 2) 01/18/2023 (Originally 10/09/1969)   Medicare Annual Wellness (AWV)  02/02/2023 (Originally 02/05/2020)   COVID-19 Vaccine (6 - 2023-24 season) 07/22/2023  (Originally 05/06/2022)   Pneumonia Vaccine 34+ Years old (1 of 1 - PCV) 01/03/2024 (Originally 10/08/2013)   INFLUENZA VACCINE  04/06/2023   MAMMOGRAM  10/26/2024   DTaP/Tdap/Td (2 - Td or Tdap) 05/05/2027   COLONOSCOPY (Pts 45-103yrs Insurance coverage will need to be confirmed)  03/31/2029   DEXA SCAN  Completed   Hepatitis C Screening  Completed   HPV VACCINES  Aged Out     ----------------------------------------------------------------------------------------------------------------------------------------------------------------------------------------------------------------- Physical Exam BP 124/71 (BP Location: Left Arm, Patient Position: Sitting, Cuff Size: Normal)   Pulse 76   Ht 5\' 4"  (1.626 m)   Wt 147 lb (66.7 kg)   SpO2 96%   BMI 25.23 kg/m   Physical Exam Constitutional:      General: She is not in acute distress. HENT:     Head: Normocephalic and atraumatic.     Right Ear: Tympanic membrane and ear canal normal.     Left Ear: Tympanic membrane and ear canal normal.     Ears:     Comments: Small pre-auricular node.  Non-tender.     Nose: Nose normal.  Eyes:     General: No scleral icterus.    Conjunctiva/sclera: Conjunctivae normal.  Neck:     Thyroid: No thyromegaly.  Cardiovascular:     Rate and Rhythm: Normal rate and regular rhythm.     Heart sounds: Normal heart sounds.  Pulmonary:     Effort: Pulmonary effort is normal.     Breath sounds: Normal breath sounds.  Abdominal:     General: Bowel sounds are normal. There is no distension.     Palpations: Abdomen is soft.     Tenderness: There is no abdominal tenderness. There is no guarding.  Musculoskeletal:        General: Normal range of motion.     Cervical back: Normal range of motion and neck supple.  Lymphadenopathy:     Cervical: No cervical adenopathy.  Skin:    General: Skin is warm and dry.     Findings: No rash.     Comments: Lipoma of abdominal wall, non-tender.   Neurological:      General: No focal deficit present.     Mental Status: She is alert and oriented to person, place, and time.     Cranial Nerves: No cranial nerve deficit.     Coordination: Coordination normal.  Psychiatric:        Mood and Affect: Mood normal.        Behavior: Behavior normal.     ------------------------------------------------------------------------------------------------------------------------------------------------------------------------------------------------------------------- Assessment and Plan  Well adult exam Well adult Recent labs reviewed in detail-Recheck LFT"s in 2-3 weeks.  Immunizations: UTD Screenings: UTD Anticipatory guidance/Risk factor reduction: Recommendations per AVS.    No orders of the defined types  were placed in this encounter.   No follow-ups on file.    This visit occurred during the SARS-CoV-2 public health emergency.  Safety protocols were in place, including screening questions prior to the visit, additional usage of staff PPE, and extensive cleaning of exam room while observing appropriate contact time as indicated for disinfecting solutions.

## 2023-01-17 ENCOUNTER — Encounter: Payer: Medicare HMO | Admitting: Family Medicine

## 2023-01-24 DIAGNOSIS — R7989 Other specified abnormal findings of blood chemistry: Secondary | ICD-10-CM | POA: Diagnosis not present

## 2023-01-25 ENCOUNTER — Encounter: Payer: Self-pay | Admitting: Family Medicine

## 2023-01-25 DIAGNOSIS — R748 Abnormal levels of other serum enzymes: Secondary | ICD-10-CM

## 2023-01-25 LAB — HEPATIC FUNCTION PANEL
AG Ratio: 1.7 (calc) (ref 1.0–2.5)
ALT: 109 U/L — ABNORMAL HIGH (ref 6–29)
AST: 58 U/L — ABNORMAL HIGH (ref 10–35)
Albumin: 4.3 g/dL (ref 3.6–5.1)
Alkaline phosphatase (APISO): 60 U/L (ref 37–153)
Bilirubin, Direct: 0.1 mg/dL (ref 0.0–0.2)
Globulin: 2.5 g/dL (calc) (ref 1.9–3.7)
Indirect Bilirubin: 0.5 mg/dL (calc) (ref 0.2–1.2)
Total Bilirubin: 0.6 mg/dL (ref 0.2–1.2)
Total Protein: 6.8 g/dL (ref 6.1–8.1)

## 2023-01-27 ENCOUNTER — Ambulatory Visit (INDEPENDENT_AMBULATORY_CARE_PROVIDER_SITE_OTHER): Payer: Medicare HMO

## 2023-01-27 DIAGNOSIS — R945 Abnormal results of liver function studies: Secondary | ICD-10-CM | POA: Diagnosis not present

## 2023-01-27 DIAGNOSIS — R748 Abnormal levels of other serum enzymes: Secondary | ICD-10-CM

## 2023-01-27 DIAGNOSIS — K8020 Calculus of gallbladder without cholecystitis without obstruction: Secondary | ICD-10-CM | POA: Diagnosis not present

## 2023-01-31 ENCOUNTER — Other Ambulatory Visit: Payer: Self-pay | Admitting: Family Medicine

## 2023-01-31 ENCOUNTER — Encounter: Payer: Self-pay | Admitting: Family Medicine

## 2023-01-31 DIAGNOSIS — R748 Abnormal levels of other serum enzymes: Secondary | ICD-10-CM

## 2023-02-01 ENCOUNTER — Telehealth: Payer: Self-pay | Admitting: Specialist

## 2023-02-01 NOTE — Telephone Encounter (Signed)
Patient called about next steps for liver enzymes please call patient  [phone]

## 2023-02-01 NOTE — Telephone Encounter (Signed)
Please see result note from 5/28.    CM

## 2023-02-02 NOTE — Telephone Encounter (Signed)
Left message advising of recommendations.  

## 2023-02-07 ENCOUNTER — Ambulatory Visit (INDEPENDENT_AMBULATORY_CARE_PROVIDER_SITE_OTHER): Payer: Medicare HMO | Admitting: Family Medicine

## 2023-02-07 DIAGNOSIS — Z0000 Encounter for general adult medical examination without abnormal findings: Secondary | ICD-10-CM | POA: Diagnosis not present

## 2023-02-07 NOTE — Patient Instructions (Signed)
MEDICARE ANNUAL WELLNESS VISIT Health Maintenance Summary and Written Plan of Care  Ms. Melanie Wilkinson ,  Thank you for allowing me to perform your Medicare Annual Wellness Visit and for your ongoing commitment to your health.   Health Maintenance & Immunization History Health Maintenance  Topic Date Due   Zoster Vaccines- Shingrix (1 of 2) 05/10/2023 (Originally 10/09/1969)   COVID-19 Vaccine (6 - 2023-24 season) 07/22/2023 (Originally 05/06/2022)   Pneumonia Vaccine 27+ Years old (1 of 1 - PCV) 01/03/2024 (Originally 10/08/2013)   INFLUENZA VACCINE  04/06/2023   Medicare Annual Wellness (AWV)  02/07/2024   MAMMOGRAM  10/26/2024   DTaP/Tdap/Td (2 - Td or Tdap) 05/05/2027   Colonoscopy  03/31/2029   DEXA SCAN  Completed   Hepatitis C Screening  Completed   HPV VACCINES  Aged Out   Immunization History  Administered Date(s) Administered   Influenza, High Dose Seasonal PF 06/14/2018   Influenza,inj,Quad PF,6+ Mos 05/04/2017   Influenza-Unspecified 06/13/2018, 06/03/2019, 06/17/2021, 05/16/2022   Moderna Covid-19 Vaccine Bivalent Booster 33yrs & up 05/19/2021   Moderna Sars-Covid-2 Vaccination 10/01/2019, 10/30/2019, 07/07/2020, 01/26/2021   Tdap 05/04/2017    These are the patient goals that we discussed:  Goals Addressed               This Visit's Progress     Patient Stated (pt-stated)        Patient stated she would like to loose 10 lbs.         This is a list of Health Maintenance Items that are overdue or due now: Pneumococcal vaccine  Shingles vaccine    Orders/Referrals Placed Today: No orders of the defined types were placed in this encounter.  (Contact our referral department at [phone] if you have not spoken with someone about your referral appointment within the next 5 days)    Follow-up Plan Follow-up with Everrett Coombe, DO as planned Medicare wellness visit in one year.  Patient will access AVS on my chart.      Health Maintenance,  Female Adopting a healthy lifestyle and getting preventive care are important in promoting health and wellness. Ask your health care provider about: The right schedule for you to have regular tests and exams. Things you can do on your own to prevent diseases and keep yourself healthy. What should I know about diet, weight, and exercise? Eat a healthy diet  Eat a diet that includes plenty of vegetables, fruits, low-fat dairy products, and lean protein. Do not eat a lot of foods that are high in solid fats, added sugars, or sodium. Maintain a healthy weight Body mass index (BMI) is used to identify weight problems. It estimates body fat based on height and weight. Your health care provider can help determine your BMI and help you achieve or maintain a healthy weight. Get regular exercise Get regular exercise. This is one of the most important things you can do for your health. Most adults should: Exercise for at least 150 minutes each week. The exercise should increase your heart rate and make you sweat (moderate-intensity exercise). Do strengthening exercises at least twice a week. This is in addition to the moderate-intensity exercise. Spend less time sitting. Even light physical activity can be beneficial. Watch cholesterol and blood lipids Have your blood tested for lipids and cholesterol at 74 years of age, then have this test every 5 years. Have your cholesterol levels checked more often if: Your lipid or cholesterol levels are high. You are older than 74 years of  age. Bonita Quin are at high risk for heart disease. What should I know about cancer screening? Depending on your health history and family history, you may need to have cancer screening at various ages. This may include screening for: Breast cancer. Cervical cancer. Colorectal cancer. Skin cancer. Lung cancer. What should I know about heart disease, diabetes, and high blood pressure? Blood pressure and heart disease High blood  pressure causes heart disease and increases the risk of stroke. This is more likely to develop in people who have high blood pressure readings or are overweight. Have your blood pressure checked: Every 3-5 years if you are 74 years of age. Every year if you are 74 years old or older. Diabetes Have regular diabetes screenings. This checks your fasting blood sugar level. Have the screening done: Once every three years after age 74 if you are at a normal weight and have a low risk for diabetes. More often and at a younger age if you are overweight or have a high risk for diabetes. What should I know about preventing infection? Hepatitis B If you have a higher risk for hepatitis B, you should be screened for this virus. Talk with your health care provider to find out if you are at risk for hepatitis B infection. Hepatitis C Testing is recommended for: Everyone born from [birth year] through 1967. Anyone with known risk factors for hepatitis C. Sexually transmitted infections (STIs) Get screened for STIs, including gonorrhea and chlamydia, if: You are sexually active and are younger than 74 years of age. You are older than 74 years of age and your health care provider tells you that you are at risk for this type of infection. Your sexual activity has changed since you were last screened, and you are at increased risk for chlamydia or gonorrhea. Ask your health care provider if you are at risk. Ask your health care provider about whether you are at high risk for HIV. Your health care provider may recommend a prescription medicine to help prevent HIV infection. If you choose to take medicine to prevent HIV, you should first get tested for HIV. You should then be tested every 3 months for as long as you are taking the medicine. Pregnancy If you are about to stop having your period (premenopausal) and you may become pregnant, seek counseling before you get pregnant. Take 400 to 800 micrograms (mcg) of folic  acid every day if you become pregnant. Ask for birth control (contraception) if you want to prevent pregnancy. Osteoporosis and menopause Osteoporosis is a disease in which the bones lose minerals and strength with aging. This can result in bone fractures. If you are 25 years old or older, or if you are at risk for osteoporosis and fractures, ask your health care provider if you should: Be screened for bone loss. Take a calcium or vitamin D supplement to lower your risk of fractures. Be given hormone replacement therapy (HRT) to treat symptoms of menopause. Follow these instructions at home: Alcohol use Do not drink alcohol if: Your health care provider tells you not to drink. You are pregnant, may be pregnant, or are planning to become pregnant. If you drink alcohol: Limit how much you have to: 0-1 drink a day. Know how much alcohol is in your drink. In the [place] one drink equals one 12 oz bottle of beer (355 mL), one 5 oz glass of wine (148 mL), or one 1 oz glass of hard liquor (44 mL). Lifestyle Do not use any products  that contain nicotine or tobacco. These products include cigarettes, chewing tobacco, and vaping devices, such as e-cigarettes. If you need help quitting, ask your health care provider. Do not use street drugs. Do not share needles. Ask your health care provider for help if you need support or information about quitting drugs. General instructions Schedule regular health, dental, and eye exams. Stay current with your vaccines. Tell your health care provider if: You often feel depressed. You have ever been abused or do not feel safe at home. Summary Adopting a healthy lifestyle and getting preventive care are important in promoting health and wellness. Follow your health care provider's instructions about healthy diet, exercising, and getting tested or screened for diseases. Follow your health care provider's instructions on monitoring your cholesterol and blood  pressure. This information is not intended to replace advice given to you by your health care provider. Make sure you discuss any questions you have with your health care provider. Document Revised: 01/11/2021 Document Reviewed: 01/11/2021 Elsevier Patient Education  2024 [employer].

## 2023-02-07 NOTE — Progress Notes (Signed)
MEDICARE ANNUAL WELLNESS VISIT  02/07/2023  Telephone Visit Disclaimer This Medicare AWV was conducted by telephone due to national recommendations for restrictions regarding the COVID-19 Pandemic (e.g. social distancing).  I verified, using two identifiers, that I am speaking with Melanie Wilkinson or their authorized healthcare agent. I discussed the limitations, risks, security, and privacy concerns of performing an evaluation and management service by telephone and the potential availability of an in-person appointment in the future. The patient expressed understanding and agreed to proceed.  Location of Patient: Home Location of Provider (nurse):  In the office.  Subjective:    Melanie Wilkinson is a 72 y.o. female patient of Melanie Coombe, DO who had a Medicare Annual Wellness Visit today via telephone. Melanie Wilkinson is Retired and lives with their spouse. she has 2 children. she reports that she is socially active and does interact with friends/family regularly. she is moderately physically active and enjoys crafts, reading, watching television and working out.  Patient Care Team: Melanie Coombe, DO as PCP - General (Family Medicine)     02/07/2023    9:42 AM 09/14/2022   10:23 AM 02/05/2019    2:05 PM 04/06/2016    2:15 PM  Advanced Directives  Does Patient Have a Medical Advance Directive? Yes No;Yes Yes Yes  Type of Advance Directive Living will Healthcare Power of [place];Living will Healthcare Power of [place];Living will Living will  Does patient want to make changes to medical advance directive? No - Patient declined  No - Patient declined No - Patient declined  Copy of Healthcare Power of Attorney in Chart?  No - copy requested No - copy requested No - copy requested    Hospital Utilization Over the Past 12 Months: # of hospitalizations or ER visits: 0 # of surgeries: 0  Review of Systems    Patient reports that her overall health is unchanged compared to last year.  History obtained from  chart review and the patient  Patient Reported Readings (BP, Pulse, CBG, Weight, etc) none  Pain Assessment Pain : 0-10 Pain Score: 1  Pain Type: Chronic pain Pain Location: Other (Comment) (thigh) Pain Orientation: Right, Lateral Pain Descriptors / Indicators: Shooting, Burning, Tingling, Numbness Pain Onset: More than a month ago Pain Frequency: Intermittent Pain Relieving Factors: exercise - some relief  Pain Relieving Factors: exercise - some relief  Current Medications & Allergies (verified) Allergies as of 02/07/2023   No Known Allergies      Medication List        Accurate as of February 07, 2023  9:56 AM. If you have any questions, ask your nurse or doctor.          atorvastatin 40 MG tablet Commonly known as: LIPITOR TAKE 1 TABLET DAILY   levothyroxine 75 MCG tablet Commonly known as: SYNTHROID TAKE 1 TABLET DAILY        History (reviewed): Past Medical History:  Diagnosis Date   Fibrocystic breast    Thyroid disease    Past Surgical History:  Procedure Laterality Date   BREAST BIOPSY     CESAREAN SECTION  07/20/1980   Family History  Problem Relation Age of Onset   Hypertension Mother    Depression Mother    Varicose Veins Mother    Hypertension Father    Social History   Socioeconomic History   Marital status: Married    Spouse name: Melanie Wilkinson   Number of children: 2   Years of education: 12   Highest education level: 12th grade  Occupational History   Occupation: Customer service manager    Comment: retired  Tobacco Use   Smoking status: Former    Packs/day: 0.50    Years: 9.00    Additional pack years: 0.00    Total pack years: 4.50    Types: Cigarettes    Quit date: 12/05/1975    Years since quitting: 47.2   Smokeless tobacco: Never  Vaping Use   Vaping Use: Never used  Substance and Sexual Activity   Alcohol use: Yes    Alcohol/week: 4.0 - 5.0 standard drinks of alcohol    Types: 4 - 5 Glasses of wine per week    Comment: occasional    Drug use: No   Sexual activity: Yes    Birth control/protection: None  Other Topics Concern   Not on file  Social History Narrative   Lives with her husband. She has two children. She enjoys knitting, crocheting, crafts, reading, watching television and working out.   Social Determinants of Health   Financial Resource Strain: Low Risk  (02/03/2023)   Overall Financial Resource Strain (CARDIA)    Difficulty of Paying Living Expenses: Not hard at all  Food Insecurity: No Food Insecurity (02/03/2023)   Hunger Vital Sign    Worried About Running Out of Food in the Last Year: Never true    Ran Out of Food in the Last Year: Never true  Transportation Needs: No Transportation Needs (02/03/2023)   PRAPARE - Administrator, Civil Service (Medical): No    Lack of Transportation (Non-Medical): No  Physical Activity: Sufficiently Active (02/03/2023)   Exercise Vital Sign    Days of Exercise per Week: 5 days    Minutes of Exercise per Session: 30 min  Stress: No Stress Concern Present (02/03/2023)   [employer] of Occupational Health - Occupational Stress Questionnaire    Feeling of Stress : Not at all  Social Connections: Socially Integrated (02/07/2023)   Social Connection and Isolation Panel [NHANES]    Frequency of Communication with Friends and Family: More than three times a week    Frequency of Social Gatherings with Friends and Family: Twice a week    Attends Religious Services: More than 4 times per year    Active Member of [employer] or Organizations: Yes    Attends Banker Meetings: More than 4 times per year    Marital Status: Married    Activities of Daily Living    02/03/2023    8:45 AM  In your present state of health, do you have any difficulty performing the following activities:  Hearing? 0  Vision? 0  Difficulty concentrating or making decisions? 0  Walking or climbing stairs? 0  Dressing or bathing? 0  Doing errands, shopping? 0  Preparing  Food and eating ? N  Using the Toilet? N  In the past six months, have you accidently leaked urine? N  Do you have problems with loss of bowel control? N  Managing your Medications? N  Managing your Finances? N  Housekeeping or managing your Housekeeping? N    Patient Education/ Literacy How often do you need to have someone help you when you read instructions, pamphlets, or other written materials from your doctor or pharmacy?: 1 - Never What is the last grade level you completed in school?: 12th grade  Exercise Current Exercise Habits: Home exercise routine, Type of exercise: stretching;strength training/weights, Time (Minutes): 30, Frequency (Times/Week): 3, Weekly Exercise (Minutes/Week): 90, Intensity: Moderate, Exercise limited by:  None identified  Diet Patient reports consuming 3 meals a day and 1 snack(s) a day Patient reports that her primary diet is: Regular Patient reports that she does have regular access to food.   Depression Screen    02/07/2023    9:43 AM 01/03/2023    2:56 PM 10/20/2021   10:06 AM 01/19/2021    8:31 AM 02/07/2019   11:35 AM 02/05/2019    2:06 PM 04/20/2017   10:56 AM  PHQ 2/9 Scores  PHQ - 2 Score 0 0 0 0 0 0 0  PHQ- 9 Score     0       Fall Risk    02/07/2023    9:43 AM 02/03/2023    8:45 AM 01/03/2023    2:56 PM 10/20/2021   10:06 AM 01/19/2021    8:31 AM  Fall Risk   Falls in the past year? 1 1 1  0 0  Number falls in past yr: 0 0 0 0 0  Injury with Fall? 1 1 1  0 0  Risk for fall due to : History of fall(s);Impaired mobility  History of fall(s) No Fall Risks No Fall Risks  Follow up Falls evaluation completed;Education provided;Falls prevention discussed  Falls evaluation completed Falls evaluation completed Falls evaluation completed     Objective:  Melanie Wilkinson seemed alert and oriented and she participated appropriately during our telephone visit.  Blood Pressure Weight BMI  BP Readings from Last 3 Encounters:  01/03/23 124/71  08/04/22  137/85  10/20/21 135/87   Wt Readings from Last 3 Encounters:  01/03/23 147 lb (66.7 kg)  08/12/22 146 lb (66.2 kg)  10/20/21 146 lb (66.2 kg)   BMI Readings from Last 1 Encounters:  01/03/23 25.23 kg/m    *Unable to obtain current vital signs, weight, and BMI due to telephone visit type  Hearing/Vision  Emmalise did not seem to have difficulty with hearing/understanding during the telephone conversation Reports that she has had a formal eye exam by an eye care professional within the past year Reports that she has not had a formal hearing evaluation within the past year *Unable to fully assess hearing and vision during telephone visit type  Cognitive Function:    02/07/2023    9:46 AM 02/05/2019    2:08 PM  6CIT Screen  What Year? 0 points 0 points  What month? 0 points 0 points  What time? 0 points 0 points  Count back from 20 0 points 0 points  Months in reverse 0 points 0 points  Repeat phrase 0 points   Total Score 0 points    (Normal:0-7, Significant for Dysfunction: >8)  Normal Cognitive Function Screening: Yes   Immunization & Health Maintenance Record Immunization History  Administered Date(s) Administered   Influenza, High Dose Seasonal PF 06/14/2018   Influenza,inj,Quad PF,6+ Mos 05/04/2017   Influenza-Unspecified 06/13/2018, 06/03/2019, 06/17/2021, 05/16/2022   Moderna Covid-19 Vaccine Bivalent Booster 40yrs & up 05/19/2021   Moderna Sars-Covid-2 Vaccination 10/01/2019, 10/30/2019, 07/07/2020, 01/26/2021   Tdap 05/04/2017    Health Maintenance  Topic Date Due   Zoster Vaccines- Shingrix (1 of 2) 05/10/2023 (Originally 10/09/1969)   COVID-19 Vaccine (6 - 2023-24 season) 07/22/2023 (Originally 05/06/2022)   Pneumonia Vaccine 97+ Years old (1 of 1 - PCV) 01/03/2024 (Originally 10/08/2013)   INFLUENZA VACCINE  04/06/2023   Medicare Annual Wellness (AWV)  02/07/2024   MAMMOGRAM  10/26/2024   DTaP/Tdap/Td (2 - Td or Tdap) 05/05/2027   Colonoscopy  03/31/2029   DEXA  SCAN  Completed   Hepatitis C Screening  Completed   HPV VACCINES  Aged Out       Assessment  This is a routine wellness examination for Melanie Wilkinson.  Health Maintenance: Due or Overdue There are no preventive care reminders to display for this patient.   Melanie Wilkinson does not need a referral for Community Assistance: Care Management:   no Social Work:    no Prescription Assistance:  no Nutrition/Diabetes Education:  no   Plan:  Personalized Goals  Goals Addressed               This Visit's Progress     Patient Stated (pt-stated)        Patient stated she would like to loose 10 lbs.       Personalized Health Maintenance & Screening Recommendations  Pneumococcal vaccine  Shingles vaccine  Patient declined the vaccines at the pharmacy.  Lung Cancer Screening Recommended: no (Low Dose CT Chest recommended if Age 62-80 years, 20 pack-year currently smoking OR have quit w/in past 15 years) Hepatitis C Screening recommended: no HIV Screening recommended: no  Advanced Directives: Written information was not prepared per patient's request.  Referrals & Orders No orders of the defined types were placed in this encounter.   Follow-up Plan Follow-up with Melanie Coombe, DO as planned Medicare wellness visit in one year.  Patient will access AVS on my chart.   I have personally reviewed and noted the following in the patient's chart:   Medical and social history Use of alcohol, tobacco or illicit drugs  Current medications and supplements Functional ability and status Nutritional status Physical activity Advanced directives List of other physicians Hospitalizations, surgeries, and ER visits in previous 12 months Vitals Screenings to include cognitive, depression, and falls Referrals and appointments  In addition, I have reviewed and discussed with Melanie Wilkinson certain preventive protocols, quality metrics, and best practice recommendations. A written personalized  care plan for preventive services as well as general preventive health recommendations is available and can be mailed to the patient at her request.      Modesto Charon, RN BSN  02/07/2023

## 2023-02-15 DIAGNOSIS — R7989 Other specified abnormal findings of blood chemistry: Secondary | ICD-10-CM | POA: Insufficient documentation

## 2023-02-15 DIAGNOSIS — M8580 Other specified disorders of bone density and structure, unspecified site: Secondary | ICD-10-CM | POA: Diagnosis not present

## 2023-03-02 DIAGNOSIS — R7989 Other specified abnormal findings of blood chemistry: Secondary | ICD-10-CM | POA: Diagnosis not present

## 2023-03-03 DIAGNOSIS — L821 Other seborrheic keratosis: Secondary | ICD-10-CM | POA: Diagnosis not present

## 2023-03-03 DIAGNOSIS — D0361 Melanoma in situ of right upper limb, including shoulder: Secondary | ICD-10-CM | POA: Diagnosis not present

## 2023-03-03 DIAGNOSIS — D225 Melanocytic nevi of trunk: Secondary | ICD-10-CM | POA: Diagnosis not present

## 2023-03-06 ENCOUNTER — Encounter: Payer: Self-pay | Admitting: Family Medicine

## 2023-03-07 DIAGNOSIS — Z23 Encounter for immunization: Secondary | ICD-10-CM | POA: Diagnosis not present

## 2023-04-07 DIAGNOSIS — Z23 Encounter for immunization: Secondary | ICD-10-CM | POA: Diagnosis not present

## 2023-05-17 DIAGNOSIS — R7989 Other specified abnormal findings of blood chemistry: Secondary | ICD-10-CM | POA: Diagnosis not present

## 2023-05-24 ENCOUNTER — Encounter: Payer: Self-pay | Admitting: Family Medicine

## 2023-05-24 DIAGNOSIS — E785 Hyperlipidemia, unspecified: Secondary | ICD-10-CM

## 2023-05-29 DIAGNOSIS — E785 Hyperlipidemia, unspecified: Secondary | ICD-10-CM | POA: Diagnosis not present

## 2023-05-30 DIAGNOSIS — R7989 Other specified abnormal findings of blood chemistry: Secondary | ICD-10-CM | POA: Diagnosis not present

## 2023-05-30 LAB — HEPATIC FUNCTION PANEL
ALT: 25 IU/L (ref 0–32)
AST: 24 IU/L (ref 0–40)
Albumin: 4.4 g/dL (ref 3.8–4.8)
Alkaline Phosphatase: 68 IU/L (ref 44–121)
Bilirubin Total: 0.5 mg/dL (ref 0.0–1.2)
Bilirubin, Direct: 0.12 mg/dL (ref 0.00–0.40)
Total Protein: 7 g/dL (ref 6.0–8.5)

## 2023-05-30 LAB — LIPID PANEL WITH LDL/HDL RATIO
Cholesterol, Total: 288 mg/dL — ABNORMAL HIGH (ref 100–199)
HDL: 65 mg/dL (ref >39)
LDL Chol Calc (NIH): 202 mg/dL — ABNORMAL HIGH (ref 0–99)
LDL/HDL Ratio: 3.1 ratio (ref 0.0–3.2)
Triglycerides: 118 mg/dL (ref 0–149)
VLDL Cholesterol Cal: 21 mg/dL (ref 5–40)

## 2023-06-02 ENCOUNTER — Encounter: Payer: Self-pay | Admitting: Family Medicine

## 2023-06-22 ENCOUNTER — Other Ambulatory Visit: Payer: Self-pay | Admitting: Family Medicine

## 2023-06-22 ENCOUNTER — Telehealth: Payer: Self-pay

## 2023-06-22 MED ORDER — REPATHA SURECLICK 140 MG/ML ~~LOC~~ SOAJ: 140.0000 mg | SUBCUTANEOUS | 1 refills | Status: DC

## 2023-06-22 NOTE — Telephone Encounter (Signed)
Attempted call and left a voice mail message requesting a return call.

## 2023-06-22 NOTE — Telephone Encounter (Addendum)
Initiated Prior authorization ZOX:[id number] SureClick 140MG /ML auto-injectors Via: Covermymeds Case/Key:[id number] Status: approved  as of 06/22/23 Reason:Authorization Expiration Date: 09/05/2023 Notified Pt via: Mychart

## 2023-06-23 NOTE — Telephone Encounter (Signed)
Patient advised.

## 2023-07-10 ENCOUNTER — Other Ambulatory Visit: Payer: Self-pay | Admitting: Family Medicine

## 2023-07-10 DIAGNOSIS — E038 Other specified hypothyroidism: Secondary | ICD-10-CM

## 2023-08-11 ENCOUNTER — Encounter: Payer: Self-pay | Admitting: Family Medicine

## 2023-08-11 DIAGNOSIS — E785 Hyperlipidemia, unspecified: Secondary | ICD-10-CM

## 2023-09-15 ENCOUNTER — Other Ambulatory Visit: Payer: Self-pay | Admitting: Family Medicine

## 2023-09-15 DIAGNOSIS — E785 Hyperlipidemia, unspecified: Secondary | ICD-10-CM | POA: Diagnosis not present

## 2023-09-15 DIAGNOSIS — Z1231 Encounter for screening mammogram for malignant neoplasm of breast: Secondary | ICD-10-CM

## 2023-09-17 LAB — HEPATIC FUNCTION PANEL
ALT: 30 [IU]/L (ref 0–32)
AST: 24 [IU]/L (ref 0–40)
Albumin: 4.6 g/dL (ref 3.8–4.8)
Alkaline Phosphatase: 60 [IU]/L (ref 44–121)
Bilirubin Total: 0.6 mg/dL (ref 0.0–1.2)
Bilirubin, Direct: 0.2 mg/dL (ref 0.00–0.40)
Total Protein: 7.1 g/dL (ref 6.0–8.5)

## 2023-09-17 LAB — LIPID PANEL WITH LDL/HDL RATIO
Cholesterol, Total: 280 mg/dL — ABNORMAL HIGH (ref 100–199)
HDL: 83 mg/dL (ref >39)
LDL Chol Calc (NIH): 182 mg/dL — ABNORMAL HIGH (ref 0–99)
LDL/HDL Ratio: 2.2 {ratio} (ref 0.0–3.2)
Triglycerides: 92 mg/dL (ref 0–149)
VLDL Cholesterol Cal: 15 mg/dL (ref 5–40)

## 2023-09-18 ENCOUNTER — Encounter: Payer: Self-pay | Admitting: Family Medicine

## 2023-11-01 ENCOUNTER — Ambulatory Visit: Payer: Medicare HMO

## 2023-11-01 DIAGNOSIS — Z1231 Encounter for screening mammogram for malignant neoplasm of breast: Secondary | ICD-10-CM

## 2023-12-08 ENCOUNTER — Encounter: Payer: Self-pay | Admitting: Family Medicine

## 2023-12-08 DIAGNOSIS — E785 Hyperlipidemia, unspecified: Secondary | ICD-10-CM

## 2023-12-14 DIAGNOSIS — E785 Hyperlipidemia, unspecified: Secondary | ICD-10-CM | POA: Diagnosis not present

## 2023-12-15 ENCOUNTER — Encounter: Payer: Self-pay | Admitting: Family Medicine

## 2023-12-15 LAB — HEPATIC FUNCTION PANEL
ALT: 20 IU/L (ref 0–32)
AST: 20 IU/L (ref 0–40)
Albumin: 4.4 g/dL (ref 3.8–4.8)
Alkaline Phosphatase: 67 IU/L (ref 44–121)
Bilirubin Total: 0.3 mg/dL (ref 0.0–1.2)
Bilirubin, Direct: 0.11 mg/dL (ref 0.00–0.40)
Total Protein: 6.8 g/dL (ref 6.0–8.5)

## 2023-12-15 LAB — LIPID PANEL WITH LDL/HDL RATIO
Cholesterol, Total: 278 mg/dL — ABNORMAL HIGH (ref 100–199)
HDL: 67 mg/dL (ref >39)
LDL Chol Calc (NIH): 187 mg/dL — ABNORMAL HIGH (ref 0–99)
LDL/HDL Ratio: 2.8 ratio (ref 0.0–3.2)
Triglycerides: 132 mg/dL (ref 0–149)
VLDL Cholesterol Cal: 24 mg/dL (ref 5–40)

## 2023-12-21 ENCOUNTER — Other Ambulatory Visit: Payer: Self-pay | Admitting: Family Medicine

## 2023-12-21 DIAGNOSIS — E038 Other specified hypothyroidism: Secondary | ICD-10-CM

## 2024-01-09 DIAGNOSIS — H524 Presbyopia: Secondary | ICD-10-CM | POA: Diagnosis not present

## 2024-02-15 ENCOUNTER — Encounter: Payer: Self-pay | Admitting: Family Medicine

## 2024-02-29 ENCOUNTER — Encounter

## 2024-03-05 DIAGNOSIS — D1801 Hemangioma of skin and subcutaneous tissue: Secondary | ICD-10-CM | POA: Diagnosis not present

## 2024-03-05 DIAGNOSIS — D229 Melanocytic nevi, unspecified: Secondary | ICD-10-CM | POA: Diagnosis not present

## 2024-03-05 DIAGNOSIS — Z85820 Personal history of malignant melanoma of skin: Secondary | ICD-10-CM | POA: Diagnosis not present

## 2024-03-05 DIAGNOSIS — Z1283 Encounter for screening for malignant neoplasm of skin: Secondary | ICD-10-CM | POA: Diagnosis not present

## 2024-05-07 ENCOUNTER — Encounter: Payer: Self-pay | Admitting: Sports Medicine

## 2024-05-31 ENCOUNTER — Encounter: Payer: Self-pay | Admitting: Family Medicine

## 2024-05-31 DIAGNOSIS — Z0000 Encounter for general adult medical examination without abnormal findings: Secondary | ICD-10-CM

## 2024-05-31 DIAGNOSIS — E038 Other specified hypothyroidism: Secondary | ICD-10-CM

## 2024-05-31 DIAGNOSIS — M810 Age-related osteoporosis without current pathological fracture: Secondary | ICD-10-CM

## 2024-05-31 DIAGNOSIS — E785 Hyperlipidemia, unspecified: Secondary | ICD-10-CM

## 2024-06-17 DIAGNOSIS — M810 Age-related osteoporosis without current pathological fracture: Secondary | ICD-10-CM | POA: Diagnosis not present

## 2024-06-17 DIAGNOSIS — E785 Hyperlipidemia, unspecified: Secondary | ICD-10-CM | POA: Diagnosis not present

## 2024-06-17 DIAGNOSIS — E038 Other specified hypothyroidism: Secondary | ICD-10-CM | POA: Diagnosis not present

## 2024-06-18 LAB — CBC WITH DIFFERENTIAL/PLATELET
Basophils Absolute: 0.1 x10E3/uL (ref 0.0–0.2)
Basos: 1 %
EOS (ABSOLUTE): 0.2 x10E3/uL (ref 0.0–0.4)
Eos: 5 %
Hematocrit: 46.3 % (ref 34.0–46.6)
Hemoglobin: 15 g/dL (ref 11.1–15.9)
Immature Grans (Abs): 0 x10E3/uL (ref 0.0–0.1)
Immature Granulocytes: 0 %
Lymphocytes Absolute: 1.9 x10E3/uL (ref 0.7–3.1)
Lymphs: 45 %
MCH: 31.3 pg (ref 26.6–33.0)
MCHC: 32.4 g/dL (ref 31.5–35.7)
MCV: 97 fL (ref 79–97)
Monocytes Absolute: 0.4 x10E3/uL (ref 0.1–0.9)
Monocytes: 10 %
Neutrophils Absolute: 1.7 x10E3/uL (ref 1.4–7.0)
Neutrophils: 39 %
Platelets: 360 x10E3/uL (ref 150–450)
RBC: 4.8 x10E6/uL (ref 3.77–5.28)
RDW: 12.6 % (ref 11.7–15.4)
WBC: 4.3 x10E3/uL (ref 3.4–10.8)

## 2024-06-18 LAB — CMP14+EGFR
ALT: 17 IU/L (ref 0–32)
AST: 22 IU/L (ref 0–40)
Albumin: 4.4 g/dL (ref 3.8–4.8)
Alkaline Phosphatase: 66 IU/L (ref 49–135)
BUN/Creatinine Ratio: 21 (ref 12–28)
BUN: 15 mg/dL (ref 8–27)
Bilirubin Total: 0.3 mg/dL (ref 0.0–1.2)
CO2: 22 mmol/L (ref 20–29)
Calcium: 9.7 mg/dL (ref 8.7–10.3)
Chloride: 106 mmol/L (ref 96–106)
Creatinine, Ser: 0.73 mg/dL (ref 0.57–1.00)
Globulin, Total: 2.3 g/dL (ref 1.5–4.5)
Glucose: 88 mg/dL (ref 70–99)
Potassium: 4.8 mmol/L (ref 3.5–5.2)
Sodium: 141 mmol/L (ref 134–144)
Total Protein: 6.7 g/dL (ref 6.0–8.5)
eGFR: 86 mL/min/1.73 (ref >59)

## 2024-06-18 LAB — LIPID PANEL WITH LDL/HDL RATIO
Cholesterol, Total: 279 mg/dL — ABNORMAL HIGH (ref 100–199)
HDL: 75 mg/dL (ref >39)
LDL Chol Calc (NIH): 188 mg/dL — ABNORMAL HIGH (ref 0–99)
LDL/HDL Ratio: 2.5 ratio (ref 0.0–3.2)
Triglycerides: 96 mg/dL (ref 0–149)
VLDL Cholesterol Cal: 16 mg/dL (ref 5–40)

## 2024-06-18 LAB — TSH: TSH: 1.96 u[IU]/mL (ref 0.450–4.500)

## 2024-06-18 LAB — VITAMIN D 25 HYDROXY (VIT D DEFICIENCY, FRACTURES): Vit D, 25-Hydroxy: 37.5 ng/mL (ref 30.0–100.0)

## 2024-06-20 ENCOUNTER — Encounter: Admitting: Family Medicine

## 2024-06-22 ENCOUNTER — Other Ambulatory Visit: Payer: Self-pay | Admitting: Family Medicine

## 2024-06-22 DIAGNOSIS — E038 Other specified hypothyroidism: Secondary | ICD-10-CM

## 2024-06-25 ENCOUNTER — Encounter: Payer: Self-pay | Admitting: Family Medicine

## 2024-06-25 ENCOUNTER — Ambulatory Visit: Admitting: Family Medicine

## 2024-06-25 VITALS — BP 120/70 | HR 69 | Ht 63.39 in | Wt 133.9 lb

## 2024-06-25 DIAGNOSIS — Z0000 Encounter for general adult medical examination without abnormal findings: Secondary | ICD-10-CM

## 2024-06-25 DIAGNOSIS — E785 Hyperlipidemia, unspecified: Secondary | ICD-10-CM | POA: Diagnosis not present

## 2024-06-25 NOTE — Progress Notes (Signed)
 Melanie Wilkinson - 73 y.o. female MRN [id number]  Date of birth: [birth year]  Subjective Chief Complaint  Patient presents with   Annual Exam    HPI Melanie Wilkinson is a 73 y.o. female here today for annual exam.   She reports that she is doing well.   She had labs completed prior to visit.  LDL remains elevated.  She has not tried Repatha  yet. All other labs are normal.   She remains pretty active.  She feels that diet is pretty good.   She is a non-smoker.  Occasional EtOH intake.   Review of Systems  Constitutional:  Negative for chills, fever, malaise/fatigue and weight loss.  HENT:  Negative for congestion, ear pain and sore throat.   Eyes:  Negative for blurred vision, double vision and pain.  Respiratory:  Negative for cough and shortness of breath.   Cardiovascular:  Negative for chest pain and palpitations.  Gastrointestinal:  Negative for abdominal pain, blood in stool, constipation, heartburn and nausea.  Genitourinary:  Negative for dysuria and urgency.  Musculoskeletal:  Negative for joint pain and myalgias.  Neurological:  Negative for dizziness and headaches.  Endo/Heme/Allergies:  Does not bruise/bleed easily.  Psychiatric/Behavioral:  Negative for depression. The patient is not nervous/anxious and does not have insomnia.     No Known Allergies  Past Medical History:  Diagnosis Date   Fibrocystic breast    Thyroid  disease     Past Surgical History:  Procedure Laterality Date   BREAST BIOPSY     CESAREAN SECTION  07/20/1980    Social History   Socioeconomic History   Marital status: Married    Spouse name: Prentice   Number of children: 2   Years of education: 12   Highest education level: 12th grade  Occupational History   Occupation: Customer service manager    Comment: retired  Tobacco Use   Smoking status: Former    Current packs/day: 0.00    Average packs/day: 0.5 packs/day for 9.0 years (4.5 ttl pk-yrs)    Types: Cigarettes    Start date: 12/05/1966    Quit  date: 12/05/1975    Years since quitting: 48.5   Smokeless tobacco: Never  Vaping Use   Vaping status: Never Used  Substance and Sexual Activity   Alcohol use: Yes    Alcohol/week: 4.0 - 5.0 standard drinks of alcohol    Types: 4 - 5 Glasses of wine per week    Comment: occasional   Drug use: No   Sexual activity: Yes    Birth control/protection: None  Other Topics Concern   Not on file  Social History Narrative   Lives with her husband. She has two children. She enjoys knitting, crocheting, crafts, reading, watching television and working out.   Social Drivers of Corporate investment banker Strain: Low Risk  (06/24/2024)   Overall Financial Resource Strain (CARDIA)    Difficulty of Paying Living Expenses: Not hard at all  Food Insecurity: No Food Insecurity (06/24/2024)   Hunger Vital Sign    Worried About Running Out of Food in the Last Year: Never true    Ran Out of Food in the Last Year: Never true  Transportation Needs: No Transportation Needs (06/24/2024)   PRAPARE - Administrator, Civil Service (Medical): No    Lack of Transportation (Non-Medical): No  Physical Activity: Sufficiently Active (06/24/2024)   Exercise Vital Sign    Days of Exercise per Week: 5 days    Minutes  of Exercise per Session: 60 min  Stress: No Stress Concern Present (06/24/2024)   [employer] of Occupational Health - Occupational Stress Questionnaire    Feeling of Stress: Not at all  Social Connections: Socially Integrated (06/24/2024)   Social Connection and Isolation Panel    Frequency of Communication with Friends and Family: Twice a week    Frequency of Social Gatherings with Friends and Family: Three times a week    Attends Religious Services: More than 4 times per year    Active Member of Clubs or Organizations: Yes    Attends Banker Meetings: 1 to 4 times per year    Marital Status: Married    Family History  Problem Relation Age of Onset    Hypertension Mother    Depression Mother    Varicose Veins Mother    Hypertension Father     Health Maintenance  Topic Date Due   Medicare Annual Wellness (AWV)  02/07/2024   Zoster Vaccines- Shingrix (1 of 2) 09/25/2024 (Originally 10/09/1969)   Pneumococcal Vaccine: 50+ Years (1 of 1 - PCV) 06/25/2025 (Originally 10/08/1998)   COVID-19 Vaccine (7 - Moderna risk 2025-26 season) 11/21/2024   DTaP/Tdap/Td (2 - Td or Tdap) 05/05/2027   Colonoscopy  03/31/2029   Influenza Vaccine  Completed   DEXA SCAN  Completed   Hepatitis C Screening  Completed   Meningococcal B Vaccine  Aged Out   Hepatitis B Vaccines 19-59 Average Risk  Discontinued   Mammogram  Discontinued     ----------------------------------------------------------------------------------------------------------------------------------------------------------------------------------------------------------------- Physical Exam BP 120/70 (BP Location: Left Arm, Patient Position: Sitting, Cuff Size: Small)   Pulse 69   Ht 5' 3.39 (1.61 m)   Wt 133 lb 14.4 oz (60.7 kg)   SpO2 97%   BMI 23.43 kg/m   Physical Exam Constitutional:      General: She is not in acute distress. HENT:     Head: Normocephalic and atraumatic.     Right Ear: Tympanic membrane and ear canal normal.     Left Ear: Tympanic membrane and ear canal normal.     Nose: Nose normal.  Eyes:     General: No scleral icterus.    Conjunctiva/sclera: Conjunctivae normal.  Neck:     Thyroid : No thyromegaly.  Cardiovascular:     Rate and Rhythm: Normal rate and regular rhythm.     Heart sounds: Normal heart sounds.  Pulmonary:     Effort: Pulmonary effort is normal.     Breath sounds: Normal breath sounds.  Abdominal:     General: Bowel sounds are normal. There is no distension.     Palpations: Abdomen is soft.     Tenderness: There is no abdominal tenderness. There is no guarding.  Musculoskeletal:        General: Normal range of motion.     Cervical  back: Normal range of motion and neck supple.  Lymphadenopathy:     Cervical: No cervical adenopathy.  Skin:    General: Skin is warm and dry.     Findings: No rash.  Neurological:     General: No focal deficit present.     Mental Status: She is alert and oriented to person, place, and time.     Cranial Nerves: No cranial nerve deficit.     Coordination: Coordination normal.  Psychiatric:        Mood and Affect: Mood normal.        Behavior: Behavior normal.     ------------------------------------------------------------------------------------------------------------------------------------------------------------------------------------------------------------------- Assessment and Plan  Well adult exam Well adult Labs reviewed with her. Immunizations: UTD Screenings: UTD Anticipatory guidance/Risk factor reduction: Recommendations per AVS.    No orders of the defined types were placed in this encounter.   No follow-ups on file.

## 2024-06-25 NOTE — Patient Instructions (Signed)
 Preventive Care 83 Years and Older, Female Preventive care refers to lifestyle choices and visits with your health care provider that can promote health and wellness. Preventive care visits are also called wellness exams. What can I expect for my preventive care visit? Counseling Your health care provider may ask you questions about your: Medical history, including: Past medical problems. Family medical history. Pregnancy and menstrual history. History of falls. Current health, including: Memory and ability to understand (cognition). Emotional well-being. Home life and relationship well-being. Sexual activity and sexual health. Lifestyle, including: Alcohol, nicotine or tobacco, and drug use. Access to firearms. Diet, exercise, and sleep habits. Work and work Astronomer. Sunscreen use. Safety issues such as seatbelt and bike helmet use. Physical exam Your health care provider will check your: Height and weight. These may be used to calculate your BMI (body mass index). BMI is a measurement that tells if you are at a healthy weight. Waist circumference. This measures the distance around your waistline. This measurement also tells if you are at a healthy weight and may help predict your risk of certain diseases, such as type 2 diabetes and high blood pressure. Heart rate and blood pressure. Body temperature. Skin for abnormal spots. What immunizations do I need?  Vaccines are usually given at various ages, according to a schedule. Your health care provider will recommend vaccines for you based on your age, medical history, and lifestyle or other factors, such as travel or where you work. What tests do I need? Screening Your health care provider may recommend screening tests for certain conditions. This may include: Lipid and cholesterol levels. Hepatitis C test. Hepatitis B test. HIV (human immunodeficiency virus) test. STI (sexually transmitted infection) testing, if you are at  risk. Lung cancer screening. Colorectal cancer screening. Diabetes screening. This is done by checking your blood sugar (glucose) after you have not eaten for a while (fasting). Mammogram. Talk with your health care provider about how often you should have regular mammograms. BRCA-related cancer screening. This may be done if you have a family history of breast, ovarian, tubal, or peritoneal cancers. Bone density scan. This is done to screen for osteoporosis. Talk with your health care provider about your test results, treatment options, and if necessary, the need for more tests. Follow these instructions at home: Eating and drinking  Eat a diet that includes fresh fruits and vegetables, whole grains, lean protein, and low-fat dairy products. Limit your intake of foods with high amounts of sugar, saturated fats, and salt. Take vitamin and mineral supplements as recommended by your health care provider. Do not drink alcohol if your health care provider tells you not to drink. If you drink alcohol: Limit how much you have to 0-1 drink a day. Know how much alcohol is in your drink. In the [place] one drink equals one 12 oz bottle of beer (355 mL), one 5 oz glass of wine (148 mL), or one 1 oz glass of hard liquor (44 mL). Lifestyle Brush your teeth every morning and night with fluoride toothpaste. Floss one time each day. Exercise for at least 30 minutes 5 or more days each week. Do not use any products that contain nicotine or tobacco. These products include cigarettes, chewing tobacco, and vaping devices, such as e-cigarettes. If you need help quitting, ask your health care provider. Do not use drugs. If you are sexually active, practice safe sex. Use a condom or other form of protection in order to prevent STIs. Take aspirin only as told by  your health care provider. Make sure that you understand how much to take and what form to take. Work with your health care provider to find out whether it  is safe and beneficial for you to take aspirin daily. Ask your health care provider if you need to take a cholesterol-lowering medicine (statin). Find healthy ways to manage stress, such as: Meditation, yoga, or listening to music. Journaling. Talking to a trusted person. Spending time with friends and family. Minimize exposure to UV radiation to reduce your risk of skin cancer. Safety Always wear your seat belt while driving or riding in a vehicle. Do not drive: If you have been drinking alcohol. Do not ride with someone who has been drinking. When you are tired or distracted. While texting. If you have been using any mind-altering substances or drugs. Wear a helmet and other protective equipment during sports activities. If you have firearms in your house, make sure you follow all gun safety procedures. What's next? Visit your health care provider once a year for an annual wellness visit. Ask your health care provider how often you should have your eyes and teeth checked. Stay up to date on all vaccines. This information is not intended to replace advice given to you by your health care provider. Make sure you discuss any questions you have with your health care provider. Document Revised: 02/17/2021 Document Reviewed: 02/17/2021 Elsevier Patient Education  2024 [employer].

## 2024-06-25 NOTE — Assessment & Plan Note (Signed)
 Well adult Labs reviewed with her. Immunizations: UTD Screenings: UTD Anticipatory guidance/Risk factor reduction: Recommendations per AVS.

## 2024-06-26 ENCOUNTER — Ambulatory Visit

## 2024-07-01 DIAGNOSIS — L739 Follicular disorder, unspecified: Secondary | ICD-10-CM | POA: Diagnosis not present

## 2024-07-03 ENCOUNTER — Ambulatory Visit (INDEPENDENT_AMBULATORY_CARE_PROVIDER_SITE_OTHER): Payer: Self-pay

## 2024-07-03 DIAGNOSIS — E785 Hyperlipidemia, unspecified: Secondary | ICD-10-CM

## 2024-07-04 ENCOUNTER — Ambulatory Visit: Payer: Self-pay | Admitting: Family Medicine

## 2024-07-05 ENCOUNTER — Other Ambulatory Visit: Payer: Self-pay | Admitting: Family Medicine

## 2024-07-05 DIAGNOSIS — R931 Abnormal findings on diagnostic imaging of heart and coronary circulation: Secondary | ICD-10-CM

## 2024-07-05 MED ORDER — REPATHA SURECLICK 140 MG/ML ~~LOC~~ SOAJ: 140.0000 mg | SUBCUTANEOUS | 2 refills | Status: DC

## 2024-07-17 ENCOUNTER — Encounter: Payer: Self-pay | Admitting: Family Medicine

## 2024-07-17 DIAGNOSIS — E038 Other specified hypothyroidism: Secondary | ICD-10-CM

## 2024-07-17 MED ORDER — LEVOTHYROXINE SODIUM 75 MCG PO TABS
75.0000 ug | ORAL_TABLET | Freq: Every day | ORAL | 1 refills | Status: AC
Start: 2024-07-17 — End: ?

## 2024-07-26 ENCOUNTER — Encounter: Payer: Self-pay | Admitting: Family Medicine

## 2024-08-13 ENCOUNTER — Encounter: Payer: Self-pay | Admitting: Cardiology

## 2024-08-13 ENCOUNTER — Ambulatory Visit: Attending: Cardiology | Admitting: Cardiology

## 2024-08-13 VITALS — BP 130/70 | HR 80 | Ht 63.39 in | Wt 134.0 lb

## 2024-08-13 DIAGNOSIS — Z87891 Personal history of nicotine dependence: Secondary | ICD-10-CM | POA: Diagnosis not present

## 2024-08-13 DIAGNOSIS — I253 Aneurysm of heart: Secondary | ICD-10-CM

## 2024-08-13 DIAGNOSIS — E782 Mixed hyperlipidemia: Secondary | ICD-10-CM

## 2024-08-13 DIAGNOSIS — R931 Abnormal findings on diagnostic imaging of heart and coronary circulation: Secondary | ICD-10-CM | POA: Diagnosis not present

## 2024-08-13 DIAGNOSIS — R072 Precordial pain: Secondary | ICD-10-CM

## 2024-08-13 DIAGNOSIS — G250 Essential tremor: Secondary | ICD-10-CM

## 2024-08-13 MED ORDER — METOPROLOL TARTRATE 100 MG PO TABS: ORAL_TABLET | ORAL | 0 refills | Status: AC

## 2024-08-13 MED ORDER — ASPIRIN 81 MG PO TBEC: 81.0000 mg | DELAYED_RELEASE_TABLET | Freq: Every day | ORAL | Status: AC

## 2024-08-13 NOTE — Progress Notes (Unsigned)
 Cardiology Consultation:    Date:  08/13/2024   ID:  Donatella Walski, DOB 25-Nov-1950, MRN [id number]  PCP:  Alvia Bring, DO  Cardiologist:  Lamar Fitch, MD   Referring MD: Alvia Bring, DO   No chief complaint on file.   History of Present Illness:    Melanie Wilkinson is a 73 y.o. female who is being seen today for the evaluation of I have a high calcium  score at the request of Alvia Bring, DO.  Past medical history significant for dyslipidemia, hypothyroidism, more than 10 years ago she did have a cardiac catheterization done which showed normal coronaries, because some problem taking statin which give her liver function test elevation she had a calcium  score done to determine how aggressive we need to be with management of her dyslipidemia, sadly, calcium  score is elevated to 508 which is 86 percentile.  She comes to talk about this.  She is doing fine she walks dog on the regular basis she gets short of breath while walking her dog.  Denies have any typical chest pain tightness squeezing pressure burning chest but shortness of breath could be anginal equivalent.  She quit smoking many years ago, does have family history of heart issue but nothing premature.  Not on any special diet.  Does not exercise in any structured program.  I have to wait for her age she looks pretty good.  Past Medical History:  Diagnosis Date   Fibrocystic breast    Thyroid  disease     Past Surgical History:  Procedure Laterality Date   BREAST BIOPSY     CESAREAN SECTION  07/20/1980    Current Medications: Current Meds  Medication Sig   Evolocumab  (REPATHA  SURECLICK) 140 MG/ML SOAJ Inject 140 mg into the skin every 14 (fourteen) days.   levothyroxine  (SYNTHROID ) 75 MCG tablet Take 1 tablet (75 mcg total) by mouth daily.     Allergies:   Statins   Social History   Socioeconomic History   Marital status: Married    Spouse name: Prentice   Number of children: 2   Years of education: 12   Highest  education level: 12th grade  Occupational History   Occupation: customer service manager    Comment: retired  Tobacco Use   Smoking status: Former    Current packs/day: 0.00    Average packs/day: 0.5 packs/day for 9.0 years (4.5 ttl pk-yrs)    Types: Cigarettes    Start date: 12/05/1966    Quit date: 12/05/1975    Years since quitting: 48.7   Smokeless tobacco: Never  Vaping Use   Vaping status: Never Used  Substance and Sexual Activity   Alcohol use: Yes    Alcohol/week: 4.0 - 5.0 standard drinks of alcohol    Types: 4 - 5 Glasses of wine per week    Comment: occasional   Drug use: No   Sexual activity: Yes    Birth control/protection: None  Other Topics Concern   Not on file  Social History Narrative   Lives with her husband. She has two children. She enjoys knitting, crocheting, crafts, reading, watching television and working out.   Social Drivers of Corporate Investment Banker Strain: Low Risk  (06/24/2024)   Overall Financial Resource Strain (CARDIA)    Difficulty of Paying Living Expenses: Not hard at all  Food Insecurity: No Food Insecurity (06/24/2024)   Hunger Vital Sign    Worried About Running Out of Food in the Last Year: Never true  Ran Out of Food in the Last Year: Never true  Transportation Needs: No Transportation Needs (06/24/2024)   PRAPARE - Administrator, Civil Service (Medical): No    Lack of Transportation (Non-Medical): No  Physical Activity: Sufficiently Active (06/24/2024)   Exercise Vital Sign    Days of Exercise per Week: 5 days    Minutes of Exercise per Session: 60 min  Stress: No Stress Concern Present (06/24/2024)   [employer] of Occupational Health - Occupational Stress Questionnaire    Feeling of Stress: Not at all  Social Connections: Socially Integrated (06/24/2024)   Social Connection and Isolation Panel    Frequency of Communication with Friends and Family: Twice a week    Frequency of Social Gatherings with Friends  and Family: Three times a week    Attends Religious Services: More than 4 times per year    Active Member of Clubs or Organizations: Yes    Attends Banker Meetings: 1 to 4 times per year    Marital Status: Married     Family History: The patient's family history includes Depression in her mother; Hypertension in her father and mother; Varicose Veins in her mother. ROS:   Please see the history of present illness.    All 14 point review of systems negative except as described per history of present illness.  EKGs/Labs/Other Studies Reviewed:    The following studies were reviewed today:   EKG:  EKG Interpretation Date/Time:  Tuesday August 13 2024 14:31:00 EST Ventricular Rate:  80 PR Interval:  138 QRS Duration:  82 QT Interval:  392 QTC Calculation: 452 R Axis:   68  Text Interpretation: Normal sinus rhythm Nonspecific ST and T wave abnormality No previous ECGs available Confirmed by Bernie Charleston [phone]) on 08/13/2024 2:38:40 PM    Recent Labs: 06/17/2024: ALT 17; BUN 15; Creatinine, Ser 0.73; Hemoglobin 15.0; Platelets 360; Potassium 4.8; Sodium 141; TSH 1.960  Recent Lipid Panel    Component Value Date/Time   CHOL 279 (H) 06/17/2024 0817   TRIG 96 06/17/2024 0817   HDL 75 06/17/2024 0817   CHOLHDL 2.6 01/02/2023 0814   VLDL 17 04/06/2016 0837   LDLCALC 188 (H) 06/17/2024 0817   LDLCALC 87 01/02/2023 0814    Physical Exam:    VS:  BP 130/70   Pulse 80   Ht 5' 3.39 (1.61 m)   Wt 134 lb (60.8 kg)   SpO2 96%   BMI 23.45 kg/m     Wt Readings from Last 3 Encounters:  08/13/24 134 lb (60.8 kg)  06/25/24 133 lb 14.4 oz (60.7 kg)  01/03/23 147 lb (66.7 kg)     GEN:  Well nourished, well developed in no acute distress HEENT: Normal NECK: No JVD; No carotid bruits LYMPHATICS: No lymphadenopathy CARDIAC: RRR, no murmurs, no rubs, no gallops RESPIRATORY:  Clear to auscultation without rales, wheezing or rhonchi  ABDOMEN: Soft, non-tender,  non-distended MUSCULOSKELETAL:  No edema; No deformity  SKIN: Warm and dry NEUROLOGIC:  Alert and oriented x 3 PSYCHIATRIC:  Normal affect   ASSESSMENT:    1. Benign essential tremor   2. Elevated coronary artery calcium  score 580 which is 86 percentile   3. Atrial septal aneurysm   4. Mixed hyperlipidemia   5. History of smoking    PLAN:    In order of problems listed above:  Elevated calcium  score.  She does have some dyspnea on exertion which could be anginal equivalent, therefore, we will  schedule her to have coronary CT angio to make sure that calcium  does not cause significant obstruction.  In the meantime ask her to start taking 1 baby aspirin  every single day. Dyslipidemia she get high LDL at 188 that may suggest familial hyperlipidemia, I asked her to make sure that her children will be checked for cholesterol.  She is taking Repatha  already which we will continue will recheck fasting lipid profile within next few weeks. Apparently history of atrial septal defect usually of no consequences. History of smoking does not smoke anywhere more and I encouraged her to stay away from smoking As a part of evaluation for screening for vascular problem will do vascular screening   Medication Adjustments/Labs and Tests Ordered: Current medicines are reviewed at length with the patient today.  Concerns regarding medicines are outlined above.  Orders Placed This Encounter  Procedures   EKG 12-Lead   No orders of the defined types were placed in this encounter.   Signed, Lamar DOROTHA Fitch, MD, [facility]. 08/13/2024 2:58 PM    [facility]

## 2024-08-13 NOTE — Patient Instructions (Signed)
 Medication Instructions:   START: Aspirin  81mg  1 tablet daily  TAKE: Metoprolol  100mg  1 tablet 2 hours prior to CT scan   Lab Work: None Ordered If you have labs (blood work) drawn today and your tests are completely normal, you will receive your results only by: MyChart Message (if you have MyChart) OR A paper copy in the mail If you have any lab test that is abnormal or we need to change your treatment, we will call you to review the results.   Testing/Procedures:  VascuScreen  Your cardiac CT will be scheduled at one of the below locations:   [facility] [address] [place], [place] [address]  Please follow these instructions carefully (unless otherwise directed):    On the Night Before the Test: Be sure to Drink plenty of water. Do not consume any caffeinated/decaffeinated beverages or chocolate 12 hours prior to your test. Do not take any antihistamines 12 hours prior to your test.   On the Day of the Test: Drink plenty of water until 1 hour prior to the test. Do not eat any food 4 hours prior to the test. You may take your regular medications prior to the test.  Take metoprolol  (Lopressor ) two hours prior to test. FEMALES- please wear underwire-free bra if available, avoid dresses & tight clothing       After the Test: Drink plenty of water. After receiving IV contrast, you may experience a mild flushed feeling. This is normal. On occasion, you may experience a mild rash up to 24 hours after the test. This is not dangerous. If this occurs, you can take Benadryl  25 mg and increase your fluid intake. If you experience trouble breathing, this can be serious. If it is severe call 911 IMMEDIATELY. If it is mild, please call our office. If you take any of these medications: Glipizide/Metformin, Avandament, Glucavance, please do not take 48 hours after completing test unless otherwise instructed.  We will call to schedule your test 2-4 weeks out  understanding that some insurance companies will need an authorization prior to the service being performed.   For non-scheduling related questions, please contact the cardiac imaging nurse navigator should you have any questions/concerns: Camie Shutter, Cardiac Imaging Nurse Navigator Chantal Requena, Cardiac Imaging Nurse Navigator [facility] Direct Office Dial: [phone]   For scheduling needs, including cancellations and rescheduling, please call Brittany, [phone].    Follow-Up: At [facility], you and your health needs are our priority.  As part of our continuing mission to provide you with exceptional heart care, we have created designated Provider Care Teams.  These Care Teams include your primary Cardiologist (physician) and Advanced Practice Providers (APPs -  Physician Assistants and Nurse Practitioners) who all work together to provide you with the care you need, when you need it.  We recommend signing up for the patient portal called MyChart.  Sign up information is provided on this After Visit Summary.  MyChart is used to connect with patients for Virtual Visits (Telemedicine).  Patients are able to view lab/test results, encounter notes, upcoming appointments, etc.  Non-urgent messages can be sent to your provider as well.   To learn more about what you can do with MyChart, go to [email or web address].    Your next appointment:   3 month(s)  The format for your next appointment:   In Person  Provider:   Lamar Fitch, MD    Other Instructions NA

## 2024-08-14 ENCOUNTER — Encounter: Payer: Self-pay | Admitting: Cardiology

## 2024-08-26 ENCOUNTER — Ambulatory Visit (HOSPITAL_COMMUNITY)
Admission: RE | Admit: 2024-08-26 | Discharge: 2024-08-26 | Disposition: A | Discharge status: Home / Self Care | Source: Ambulatory Visit | Attending: Cardiology | Admitting: Cardiology

## 2024-08-26 ENCOUNTER — Ambulatory Visit: Payer: Self-pay | Admitting: Cardiology

## 2024-09-20 ENCOUNTER — Encounter (HOSPITAL_COMMUNITY): Payer: Self-pay

## 2024-09-23 ENCOUNTER — Other Ambulatory Visit: Payer: Self-pay | Admitting: Family Medicine

## 2024-09-24 ENCOUNTER — Encounter: Payer: Self-pay | Admitting: Family Medicine

## 2024-09-24 ENCOUNTER — Encounter (HOSPITAL_BASED_OUTPATIENT_CLINIC_OR_DEPARTMENT_OTHER): Payer: Self-pay

## 2024-09-24 ENCOUNTER — Other Ambulatory Visit (HOSPITAL_BASED_OUTPATIENT_CLINIC_OR_DEPARTMENT_OTHER): Payer: Self-pay | Admitting: Family Medicine

## 2024-09-24 ENCOUNTER — Ambulatory Visit (HOSPITAL_BASED_OUTPATIENT_CLINIC_OR_DEPARTMENT_OTHER)
Admission: RE | Admit: 2024-09-24 | Discharge: 2024-09-24 | Disposition: A | Discharge status: Home / Self Care | Source: Ambulatory Visit | Attending: Cardiology | Admitting: Cardiology

## 2024-09-24 DIAGNOSIS — R072 Precordial pain: Secondary | ICD-10-CM | POA: Insufficient documentation

## 2024-09-24 DIAGNOSIS — M810 Age-related osteoporosis without current pathological fracture: Secondary | ICD-10-CM

## 2024-09-24 DIAGNOSIS — E782 Mixed hyperlipidemia: Secondary | ICD-10-CM

## 2024-09-24 DIAGNOSIS — Z1231 Encounter for screening mammogram for malignant neoplasm of breast: Secondary | ICD-10-CM

## 2024-09-24 MED ORDER — NITROGLYCERIN 0.4 MG SL SUBL
0.8000 mg | SUBLINGUAL_TABLET | Freq: Once | SUBLINGUAL | Status: AC
  Administered 2024-09-24: 0.8 mg via SUBLINGUAL

## 2024-09-24 MED ORDER — IOHEXOL 350 MG/ML SOLN
100.0000 mL | Freq: Once | INTRAVENOUS | Status: AC | PRN
  Administered 2024-09-24: 95 mL via INTRAVENOUS

## 2024-09-24 MED ORDER — DILTIAZEM HCL 25 MG/5ML IV SOLN: 10.0000 mg | INTRAVENOUS | Status: DC | PRN

## 2024-09-24 MED ORDER — METOPROLOL TARTRATE 5 MG/5ML IV SOLN: 10.0000 mg | Freq: Once | INTRAVENOUS | Status: DC | PRN

## 2024-09-24 NOTE — Telephone Encounter (Signed)
 Last lipid panel was 06/17/24. The results were abnormal. Lipid panel labs pended.   Last bone density testing was 10/26/22. Order pended.

## 2024-10-01 ENCOUNTER — Telehealth: Payer: Self-pay

## 2024-10-01 NOTE — Telephone Encounter (Signed)
 Pt viewed CT results on My Chart per Dr. Vanetta Shawl note. Routed to PCP.

## 2024-10-04 ENCOUNTER — Ambulatory Visit: Payer: Self-pay | Admitting: Family Medicine

## 2024-10-04 LAB — LIPID PANEL WITH LDL/HDL RATIO
Cholesterol, Total: 188 mg/dL (ref 100–199)
HDL: 72 mg/dL
LDL Chol Calc (NIH): 100 mg/dL — ABNORMAL HIGH (ref 0–99)
LDL/HDL Ratio: 1.4 ratio (ref 0.0–3.2)
Triglycerides: 88 mg/dL (ref 0–149)
VLDL Cholesterol Cal: 16 mg/dL (ref 5–40)

## 2024-11-04 ENCOUNTER — Ambulatory Visit (HOSPITAL_BASED_OUTPATIENT_CLINIC_OR_DEPARTMENT_OTHER)

## 2024-11-12 ENCOUNTER — Ambulatory Visit: Admitting: Cardiology
